# Patient Record
Sex: Female | Born: 1965 | Race: White | Hispanic: No | Marital: Married | Smoking: Former smoker
Health system: Southern US, Community
[De-identification: ages and names within clinical notes are randomized; demographics above are authoritative.]

## PROBLEM LIST (undated history)

## (undated) DIAGNOSIS — R519 Headache, unspecified: Secondary | ICD-10-CM

## (undated) DIAGNOSIS — L719 Rosacea, unspecified: Secondary | ICD-10-CM

## (undated) DIAGNOSIS — G4733 Obstructive sleep apnea (adult) (pediatric): Secondary | ICD-10-CM

## (undated) DIAGNOSIS — E785 Hyperlipidemia, unspecified: Secondary | ICD-10-CM

## (undated) DIAGNOSIS — G709 Myoneural disorder, unspecified: Secondary | ICD-10-CM

## (undated) DIAGNOSIS — R011 Cardiac murmur, unspecified: Secondary | ICD-10-CM

## (undated) DIAGNOSIS — M51369 Other intervertebral disc degeneration, lumbar region without mention of lumbar back pain or lower extremity pain: Secondary | ICD-10-CM

## (undated) DIAGNOSIS — T7840XA Allergy, unspecified, initial encounter: Secondary | ICD-10-CM

## (undated) DIAGNOSIS — F411 Generalized anxiety disorder: Secondary | ICD-10-CM

## (undated) DIAGNOSIS — Z9889 Other specified postprocedural states: Secondary | ICD-10-CM

## (undated) DIAGNOSIS — T8859XA Other complications of anesthesia, initial encounter: Secondary | ICD-10-CM

## (undated) DIAGNOSIS — R112 Nausea with vomiting, unspecified: Secondary | ICD-10-CM

## (undated) DIAGNOSIS — J45909 Unspecified asthma, uncomplicated: Secondary | ICD-10-CM

## (undated) DIAGNOSIS — F952 Tourette's disorder: Secondary | ICD-10-CM

## (undated) DIAGNOSIS — J42 Unspecified chronic bronchitis: Secondary | ICD-10-CM

## (undated) DIAGNOSIS — J189 Pneumonia, unspecified organism: Secondary | ICD-10-CM

## (undated) DIAGNOSIS — L709 Acne, unspecified: Secondary | ICD-10-CM

## (undated) DIAGNOSIS — S42009A Fracture of unspecified part of unspecified clavicle, initial encounter for closed fracture: Secondary | ICD-10-CM

## (undated) DIAGNOSIS — T4145XA Adverse effect of unspecified anesthetic, initial encounter: Secondary | ICD-10-CM

## (undated) DIAGNOSIS — S43006A Unspecified dislocation of unspecified shoulder joint, initial encounter: Secondary | ICD-10-CM

## (undated) DIAGNOSIS — F32A Depression, unspecified: Secondary | ICD-10-CM

## (undated) DIAGNOSIS — G4730 Sleep apnea, unspecified: Secondary | ICD-10-CM

## (undated) DIAGNOSIS — F329 Major depressive disorder, single episode, unspecified: Secondary | ICD-10-CM

## (undated) DIAGNOSIS — M5136 Other intervertebral disc degeneration, lumbar region: Secondary | ICD-10-CM

## (undated) DIAGNOSIS — K219 Gastro-esophageal reflux disease without esophagitis: Secondary | ICD-10-CM

## (undated) HISTORY — PX: MOUTH SURGERY: SHX715

## (undated) HISTORY — PX: OTHER SURGICAL HISTORY: SHX169

## (undated) HISTORY — DX: Major depressive disorder, single episode, unspecified: F32.9

## (undated) HISTORY — DX: Allergy, unspecified, initial encounter: T78.40XA

## (undated) HISTORY — PX: KNEE ARTHROPLASTY: SHX992

## (undated) HISTORY — DX: Unspecified dislocation of unspecified shoulder joint, initial encounter: S43.006A

## (undated) HISTORY — DX: Fracture of unspecified part of unspecified clavicle, initial encounter for closed fracture: S42.009A

## (undated) HISTORY — DX: Sleep apnea, unspecified: G47.30

## (undated) HISTORY — DX: Myoneural disorder, unspecified: G70.9

## (undated) HISTORY — PX: KNEE ARTHROSCOPY: SUR90

## (undated) HISTORY — PX: BANKHART REPAIR SHOULDER: SUR120

## (undated) HISTORY — DX: Acne, unspecified: L70.9

## (undated) HISTORY — PX: RHINOPLASTY: SUR1284

## (undated) HISTORY — DX: Depression, unspecified: F32.A

---

## 2008-07-12 ENCOUNTER — Encounter: Admission: RE | Admit: 2008-07-12 | Discharge: 2008-07-12 | Payer: Self-pay | Admitting: Family Medicine

## 2008-12-29 ENCOUNTER — Encounter: Admission: RE | Admit: 2008-12-29 | Discharge: 2008-12-29 | Payer: Self-pay | Admitting: Family Medicine

## 2009-01-27 ENCOUNTER — Emergency Department (HOSPITAL_COMMUNITY): Admission: EM | Admit: 2009-01-27 | Discharge: 2009-01-27 | Payer: Self-pay | Admitting: Emergency Medicine

## 2009-08-07 ENCOUNTER — Ambulatory Visit: Payer: Self-pay | Admitting: Internal Medicine

## 2009-08-07 ENCOUNTER — Observation Stay (HOSPITAL_COMMUNITY): Admission: EM | Admit: 2009-08-07 | Discharge: 2009-08-08 | Payer: Self-pay | Admitting: Emergency Medicine

## 2009-08-28 ENCOUNTER — Ambulatory Visit: Payer: Self-pay | Admitting: Cardiovascular Disease

## 2009-08-28 DIAGNOSIS — R079 Chest pain, unspecified: Secondary | ICD-10-CM

## 2009-11-12 ENCOUNTER — Encounter: Admission: RE | Admit: 2009-11-12 | Discharge: 2009-11-12 | Payer: Self-pay | Admitting: Orthopedic Surgery

## 2010-04-16 NOTE — Assessment & Plan Note (Signed)
Summary: eph/chest pains   Visit Type:  Initial Consult Primary Provider:  Candise Bowens. Stone, MD   History of Present Illness: Danielle Krueger is seen today at the request of [facility]/  She was seen there for chest pain on 5/24.  Pain was atypical, nonexertional, sharp and both sides of chest.  R/O with normal ECG.  5/25 had normal stress echo which I reviewed.  Since D/C one self limited episode.  No associated diaphoresis, palpitations, edema or syncope.  Indicates it is different than her previous painic attacks.  Has a lot going on.  She raises 5 boys and a girl.  Works for a Management consultant.  She has a brother that she is very close to who is dying of glyoblastoma.  After review of data and stress echo I reassured her that I thought her heart was ok and she could start an exercise program at [employer].  In the future a cardiac CT would be a reasonable test for recurrent symptoms  Current Problems (verified): 1)  Chest Pain Unspecified  (ICD-786.50)  Current Medications (verified): 1)  Prestiq 50mg  .... Once Daily 2)  Menicycline 50mg  .... Two Times A Day  Allergies (verified): 1)  ! * Metrogel  Family History: negative premature CAD  Social History: Married 6 children combined remarriage Nonsmoker Occasional ETOH Works for Pension scheme manager Wants to work out at [employer]  Review of Systems       Denies fever, malais, weight loss, blurry vision, decreased visual acuity, cough, sputum, SOB, hemoptysis, pleuritic pain, palpitaitons, heartburn, abdominal pain, melena, lower extremity edema, claudication, or rash.   Vital Signs:  Patient profile:   45 year old female Height:      66 inches Weight:      160 pounds BMI:     25.92 Pulse rate:   79 / minute BP sitting:   122 / 82  (left arm)  Vitals Entered By: Laurance Flatten CMA (August 28, 2009 3:37 PM) Comments pt has concerns about sleep apnea   Physical Exam  General:  Affect appropriate Healthy:  appears stated age HEENT: normal Neck  supple with no adenopathy JVP normal no bruits no thyromegaly Lungs clear with no wheezing and good diaphragmatic motion Heart:  S1/S2 no murmur,rub, gallop or click PMI normal Abdomen: benighn, BS positve, no tenderness, no AAA no bruit.  No HSM or HJR Distal pulses intact with no bruits No edema Neuro non-focal Skin warm and dry    Impression & Recommendations:  Problem # 1:  CHEST PAIN UNSPECIFIED (ICD-786.50) Atypical with normal ECG and normal stress echo.  No need for further w/u at this time Orders: EKG w/ Interpretation (93000)  Patient Instructions: 1)  Your physician recommends that you schedule a follow-up appointment in: as needed 2)  Your physician recommends that you continue on your current medications as directed. Please refer to the Current Medication list given to you today.   EKG Report  Procedure date:  08/28/2009  Findings:      NSR 79 Normal ECG

## 2010-06-03 LAB — URINE MICROSCOPIC-ADD ON

## 2010-06-03 LAB — DIFFERENTIAL
Basophils Absolute: 0.1 10*3/uL (ref 0.0–0.1)
Eosinophils Absolute: 0.4 10*3/uL (ref 0.0–0.7)
Eosinophils Relative: 6 % — ABNORMAL HIGH (ref 0–5)
Lymphocytes Relative: 24 % (ref 12–46)
Neutro Abs: 4.9 10*3/uL (ref 1.7–7.7)
Neutrophils Relative %: 61 % (ref 43–77)

## 2010-06-03 LAB — URINALYSIS, ROUTINE W REFLEX MICROSCOPIC
Ketones, ur: NEGATIVE mg/dL
Leukocytes, UA: NEGATIVE
Nitrite: NEGATIVE
Specific Gravity, Urine: 1.027 (ref 1.005–1.030)
pH: 5.5 (ref 5.0–8.0)

## 2010-06-03 LAB — POCT CARDIAC MARKERS
CKMB, poc: <1 ng/mL — ABNORMAL LOW (ref 1.0–8.0)
Myoglobin, poc: 42 ng/mL (ref 12–200)
Myoglobin, poc: 44.6 ng/mL (ref 12–200)
Myoglobin, poc: 46.3 ng/mL (ref 12–200)
Troponin i, poc: <0.05 ng/mL (ref 0.00–0.09)
Troponin i, poc: <0.05 ng/mL (ref 0.00–0.09)

## 2010-06-03 LAB — BASIC METABOLIC PANEL
BUN: 9 mg/dL (ref 6–23)
CO2: 24 mEq/L (ref 19–32)
Creatinine, Ser: 0.56 mg/dL (ref 0.4–1.2)
GFR calc non Af Amer: >60 mL/min (ref >60)
Potassium: 3.8 mEq/L (ref 3.5–5.1)

## 2010-06-03 LAB — CBC
MCV: 95.1 fL (ref 78.0–100.0)
WBC: 7.9 10*3/uL (ref 4.0–10.5)

## 2010-09-05 ENCOUNTER — Other Ambulatory Visit: Payer: Self-pay | Admitting: Orthopedic Surgery

## 2010-09-05 DIAGNOSIS — M545 Low back pain: Secondary | ICD-10-CM

## 2010-09-10 ENCOUNTER — Ambulatory Visit
Admission: RE | Admit: 2010-09-10 | Discharge: 2010-09-10 | Disposition: A | Discharge status: Home / Self Care | Payer: Self-pay | Source: Ambulatory Visit | Attending: Orthopedic Surgery | Admitting: Orthopedic Surgery

## 2010-09-10 DIAGNOSIS — M545 Low back pain: Secondary | ICD-10-CM

## 2010-09-17 ENCOUNTER — Encounter: Payer: Self-pay | Admitting: Cardiovascular Disease

## 2010-12-31 ENCOUNTER — Other Ambulatory Visit: Payer: Self-pay | Admitting: Family Medicine

## 2010-12-31 DIAGNOSIS — Z1231 Encounter for screening mammogram for malignant neoplasm of breast: Secondary | ICD-10-CM

## 2011-01-08 ENCOUNTER — Other Ambulatory Visit: Payer: Self-pay | Admitting: Family Medicine

## 2011-01-08 DIAGNOSIS — N949 Unspecified condition associated with female genital organs and menstrual cycle: Secondary | ICD-10-CM

## 2011-01-10 ENCOUNTER — Ambulatory Visit
Admission: RE | Admit: 2011-01-10 | Discharge: 2011-01-10 | Disposition: A | Discharge status: Home / Self Care | Payer: 59 | Source: Ambulatory Visit | Attending: Family Medicine | Admitting: Family Medicine

## 2011-01-10 DIAGNOSIS — N949 Unspecified condition associated with female genital organs and menstrual cycle: Secondary | ICD-10-CM

## 2011-01-20 ENCOUNTER — Ambulatory Visit
Admission: RE | Admit: 2011-01-20 | Discharge: 2011-01-20 | Disposition: A | Discharge status: Home / Self Care | Payer: 59 | Source: Ambulatory Visit | Attending: Family Medicine | Admitting: Family Medicine

## 2011-01-20 DIAGNOSIS — Z1231 Encounter for screening mammogram for malignant neoplasm of breast: Secondary | ICD-10-CM

## 2011-11-13 ENCOUNTER — Encounter (HOSPITAL_COMMUNITY): Payer: Self-pay

## 2011-11-20 ENCOUNTER — Encounter (HOSPITAL_COMMUNITY)
Admission: RE | Admit: 2011-11-20 | Discharge: 2011-11-20 | Disposition: A | Discharge status: Home / Self Care | Payer: 59 | Source: Ambulatory Visit | Attending: Obstetrics and Gynecology | Admitting: Obstetrics and Gynecology

## 2011-11-20 ENCOUNTER — Encounter (HOSPITAL_COMMUNITY): Payer: Self-pay

## 2011-11-20 HISTORY — DX: Adverse effect of unspecified anesthetic, initial encounter: T41.45XA

## 2011-11-20 HISTORY — DX: Other complications of anesthesia, initial encounter: T88.59XA

## 2011-11-20 LAB — CBC
MCV: 89.9 fL (ref 78.0–100.0)
Platelets: 414 10*3/uL — ABNORMAL HIGH (ref 150–400)
RBC: 4.04 MIL/uL (ref 3.87–5.11)
RDW: 14.5 % (ref 11.5–15.5)
WBC: 7.5 10*3/uL (ref 4.0–10.5)

## 2011-11-20 NOTE — Patient Instructions (Addendum)
20 Danielle Krueger  11/20/2011   Your procedure is scheduled on:  11/28/11  Enter through the Main Entrance of [facility] at 6 AM.  Pick up the phone at the desk and dial [phone].   Call this number if you have problems the morning of surgery: [phone]   Remember:   Do not eat food:After Midnight.  Do not drink clear liquids: After Midnight.  Take these medicines the morning of surgery with A SIP OF WATER: NA   Do not wear jewelry, make-up or nail polish.  Do not wear lotions, powders, or perfumes. You may wear deodorant.  Do not shave 48 hours prior to surgery.  Do not bring valuables to the hospital.  Contacts, dentures or bridgework may not be worn into surgery.  Leave suitcase in the car. After surgery it may be brought to your room.  For patients admitted to the hospital, checkout time is 11:00 AM the day of discharge.   Patients discharged the day of surgery will not be allowed to drive home.  Name and phone number of your driver: husband  [name]  Special Instructions: CHG Shower Use Special Wash: 1/2 bottle night before surgery and 1/2 bottle morning of surgery.   Please read over the following fact sheets that you were given: Surgical Site Infection Prevention

## 2011-11-26 NOTE — H&P (Signed)
46 year old G 2 P 2 with husband having had a vasectomy presents for treatment of severe menorrhagia. She states her period occurs every 25 to 28 days and lasts for 3 to 4 days with large clots. She sometimes has to change her tampons three times a hour.  Ultrasound in the office showed a 6 mm and 6 mm polyps in endometrium.  Medical history History of Vulvar condyloma UTI  Surgical history  None  Meds Pristiq Ativan  Allergic to metrogel  ROS is unremarkable Social history  Married No tobacco  Afebrile Vital signs stable General alert and oriented Lung CTAB Car RRR Abdomen is soft and non tender Pelvic is normal except for above ultrasound findings  IMPRESSION: Menorrhagia Endometrial polyps  PLAN: D and C Hysteroscopy Removal of endometrial polyps Thermachoice endometrial ablation Risks reviewed with the patient Consent signed

## 2011-11-28 ENCOUNTER — Ambulatory Visit (HOSPITAL_COMMUNITY): Admission: RE | Admit: 2011-11-28 | Payer: 59 | Source: Ambulatory Visit | Admitting: Obstetrics and Gynecology

## 2011-11-28 SURGERY — DILATATION & CURETTAGE/HYSTEROSCOPY WITH THERMACHOICE ABLATION
Anesthesia: Choice

## 2011-12-01 ENCOUNTER — Encounter (HOSPITAL_COMMUNITY): Payer: Self-pay

## 2011-12-03 MED ORDER — LACTATED RINGERS IV SOLN: INTRAVENOUS | Status: DC

## 2011-12-04 ENCOUNTER — Encounter (HOSPITAL_COMMUNITY): Payer: Self-pay | Admitting: Anesthesiology

## 2011-12-04 ENCOUNTER — Ambulatory Visit (HOSPITAL_COMMUNITY)
Admission: RE | Admit: 2011-12-04 | Discharge: 2011-12-04 | Disposition: A | Discharge status: Home / Self Care | Payer: 59 | Source: Ambulatory Visit | Attending: Obstetrics and Gynecology | Admitting: Obstetrics and Gynecology

## 2011-12-04 ENCOUNTER — Ambulatory Visit (HOSPITAL_COMMUNITY): Payer: 59 | Admitting: Anesthesiology

## 2011-12-04 ENCOUNTER — Encounter (HOSPITAL_COMMUNITY)
Admission: RE | Disposition: A | Discharge status: Home / Self Care | Payer: Self-pay | Source: Ambulatory Visit | Attending: Obstetrics and Gynecology

## 2011-12-04 DIAGNOSIS — N920 Excessive and frequent menstruation with regular cycle: Secondary | ICD-10-CM | POA: Insufficient documentation

## 2011-12-04 DIAGNOSIS — N840 Polyp of corpus uteri: Secondary | ICD-10-CM

## 2011-12-04 DIAGNOSIS — R079 Chest pain, unspecified: Secondary | ICD-10-CM

## 2011-12-04 LAB — CBC
Hemoglobin: 10.8 g/dL — ABNORMAL LOW (ref 12.0–15.0)
MCH: 29.3 pg (ref 26.0–34.0)
MCHC: 32.3 g/dL (ref 30.0–36.0)
MCV: 90.5 fL (ref 78.0–100.0)
Platelets: 433 10*3/uL — ABNORMAL HIGH (ref 150–400)

## 2011-12-04 SURGERY — DILATATION & CURETTAGE/HYSTEROSCOPY WITH THERMACHOICE ABLATION
Anesthesia: General | Site: Uterus | Wound class: Clean Contaminated

## 2011-12-04 MED ORDER — LIDOCAINE HCL (CARDIAC) 20 MG/ML IV SOLN
INTRAVENOUS | Status: AC
  Filled 2011-12-04: qty 5

## 2011-12-04 MED ORDER — ONDANSETRON HCL 4 MG/2ML IJ SOLN
INTRAMUSCULAR | Status: DC | PRN
  Administered 2011-12-04: 4 mg via INTRAVENOUS

## 2011-12-04 MED ORDER — MIDAZOLAM HCL 5 MG/5ML IJ SOLN
INTRAMUSCULAR | Status: DC | PRN
  Administered 2011-12-04: 2 mg via INTRAVENOUS

## 2011-12-04 MED ORDER — PROPOFOL 10 MG/ML IV EMUL
INTRAVENOUS | Status: DC | PRN
  Administered 2011-12-04: 50 mg via INTRAVENOUS
  Administered 2011-12-04: 200 mg via INTRAVENOUS

## 2011-12-04 MED ORDER — OXYCODONE-ACETAMINOPHEN 5-325 MG PO TABS
ORAL_TABLET | ORAL | Status: AC
  Filled 2011-12-04: qty 1

## 2011-12-04 MED ORDER — LIDOCAINE HCL 1 % IJ SOLN
INTRAMUSCULAR | Status: DC | PRN
  Administered 2011-12-04: 10 mL

## 2011-12-04 MED ORDER — MIDAZOLAM HCL 2 MG/2ML IJ SOLN
INTRAMUSCULAR | Status: AC
  Filled 2011-12-04: qty 2

## 2011-12-04 MED ORDER — IBUPROFEN 200 MG PO TABS: 600.0000 mg | ORAL_TABLET | Freq: Four times a day (QID) | ORAL | Status: DC | PRN

## 2011-12-04 MED ORDER — OXYCODONE-ACETAMINOPHEN 5-325 MG PO TABS
1.0000 | ORAL_TABLET | ORAL | Status: DC | PRN
  Administered 2011-12-04: 1 via ORAL

## 2011-12-04 MED ORDER — OXYCODONE-ACETAMINOPHEN 10-325 MG PO TABS
1.0000 | ORAL_TABLET | ORAL | Status: DC | PRN
Start: 2011-12-04 — End: 2016-11-27

## 2011-12-04 MED ORDER — PROPOFOL 10 MG/ML IV EMUL
INTRAVENOUS | Status: AC
  Filled 2011-12-04: qty 20

## 2011-12-04 MED ORDER — DEXAMETHASONE SODIUM PHOSPHATE 10 MG/ML IJ SOLN
INTRAMUSCULAR | Status: AC
  Filled 2011-12-04: qty 1

## 2011-12-04 MED ORDER — MIDAZOLAM HCL 2 MG/2ML IJ SOLN: 0.5000 mg | Freq: Once | INTRAMUSCULAR | Status: DC | PRN

## 2011-12-04 MED ORDER — CEFAZOLIN SODIUM-DEXTROSE 2-3 GM-% IV SOLR
2.0000 g | INTRAVENOUS | Status: AC
  Administered 2011-12-04: 2 g via INTRAVENOUS

## 2011-12-04 MED ORDER — MEPERIDINE HCL 25 MG/ML IJ SOLN: 6.2500 mg | INTRAMUSCULAR | Status: DC | PRN

## 2011-12-04 MED ORDER — LACTATED RINGERS IV SOLN
INTRAVENOUS | Status: DC
  Administered 2011-12-04 (×2): via INTRAVENOUS

## 2011-12-04 MED ORDER — LIDOCAINE HCL (CARDIAC) 20 MG/ML IV SOLN
INTRAVENOUS | Status: DC | PRN
  Administered 2011-12-04: 60 mg via INTRAVENOUS

## 2011-12-04 MED ORDER — ONDANSETRON HCL 4 MG/2ML IJ SOLN
INTRAMUSCULAR | Status: AC
  Filled 2011-12-04: qty 2

## 2011-12-04 MED ORDER — LACTATED RINGERS IV SOLN: INTRAVENOUS | Status: DC

## 2011-12-04 MED ORDER — FENTANYL CITRATE 0.05 MG/ML IJ SOLN
INTRAMUSCULAR | Status: AC
  Filled 2011-12-04: qty 2

## 2011-12-04 MED ORDER — KETOROLAC TROMETHAMINE 30 MG/ML IJ SOLN: 15.0000 mg | Freq: Once | INTRAMUSCULAR | Status: DC | PRN

## 2011-12-04 MED ORDER — KETOROLAC TROMETHAMINE 60 MG/2ML IM SOLN
INTRAMUSCULAR | Status: DC | PRN
  Administered 2011-12-04: 30 mg via INTRAMUSCULAR

## 2011-12-04 MED ORDER — PROMETHAZINE HCL 25 MG/ML IJ SOLN: 6.2500 mg | INTRAMUSCULAR | Status: DC | PRN

## 2011-12-04 MED ORDER — FENTANYL CITRATE 0.05 MG/ML IJ SOLN: 25.0000 ug | INTRAMUSCULAR | Status: DC | PRN

## 2011-12-04 MED ORDER — DEXAMETHASONE SODIUM PHOSPHATE 4 MG/ML IJ SOLN
INTRAMUSCULAR | Status: DC | PRN
  Administered 2011-12-04: 10 mg via INTRAVENOUS

## 2011-12-04 MED ORDER — KETOROLAC TROMETHAMINE 60 MG/2ML IM SOLN
INTRAMUSCULAR | Status: AC
  Filled 2011-12-04: qty 2

## 2011-12-04 MED ORDER — KETOROLAC TROMETHAMINE 30 MG/ML IJ SOLN
INTRAMUSCULAR | Status: DC | PRN
  Administered 2011-12-04: 30 mg via INTRAVENOUS

## 2011-12-04 MED ORDER — CEFAZOLIN SODIUM-DEXTROSE 2-3 GM-% IV SOLR
INTRAVENOUS | Status: AC
  Filled 2011-12-04: qty 50

## 2011-12-04 MED ORDER — DEXTROSE 5 % IV SOLN
INTRAVENOUS | Status: DC | PRN
  Administered 2011-12-04: 1000 mL

## 2011-12-04 MED ORDER — FENTANYL CITRATE 0.05 MG/ML IJ SOLN
INTRAMUSCULAR | Status: DC | PRN
  Administered 2011-12-04 (×2): 50 ug via INTRAVENOUS

## 2011-12-04 SURGICAL SUPPLY — 13 items
CANISTER SUCTION 2500CC (MISCELLANEOUS) ×2 IMPLANT
CATH ROBINSON RED A/P 16FR (CATHETERS) ×2 IMPLANT
CATH THERMACHOICE III (CATHETERS) ×2 IMPLANT
CLOTH BEACON ORANGE TIMEOUT ST (SAFETY) ×2 IMPLANT
CONTAINER PREFILL 10% NBF 60ML (FORM) ×2 IMPLANT
DRESSING TELFA 8X3 (GAUZE/BANDAGES/DRESSINGS) ×2 IMPLANT
GLOVE BIO SURGEON STRL SZ 6.5 (GLOVE) ×4 IMPLANT
GOWN PREVENTION PLUS LG XLONG (DISPOSABLE) ×2 IMPLANT
GOWN STRL REIN XL XLG (GOWN DISPOSABLE) ×2 IMPLANT
PACK HYSTEROSCOPY LF (CUSTOM PROCEDURE TRAY) ×2 IMPLANT
PAD OB MATERNITY 4.3X12.25 (PERSONAL CARE ITEMS) ×2 IMPLANT
TOWEL OR 17X24 6PK STRL BLUE (TOWEL DISPOSABLE) ×4 IMPLANT
WATER STERILE IRR 1000ML POUR (IV SOLUTION) ×2 IMPLANT

## 2011-12-04 NOTE — Anesthesia Procedure Notes (Signed)
Procedure Name: LMA Insertion Date/Time: 12/04/2011 7:28 AM Performed by: Graciela Husbands Pre-anesthesia Checklist: Patient identified, Patient being monitored, Emergency Drugs available, Timeout performed and Suction available Patient Re-evaluated:Patient Re-evaluated prior to inductionOxygen Delivery Method: Circle system utilized Preoxygenation: Pre-oxygenation with 100% oxygen Intubation Type: IV induction Ventilation: Mask ventilation without difficulty LMA: LMA inserted LMA Size: 4.0 Number of attempts: 1 Placement Confirmation: positive ETCO2 and breath sounds checked- equal and bilateral Tube secured with: Tape Dental Injury: Teeth and Oropharynx as per pre-operative assessment

## 2011-12-04 NOTE — Anesthesia Postprocedure Evaluation (Signed)
  Anesthesia Post-op Note  Patient: Danielle Krueger  Procedure(s) Performed: Procedure(s) (LRB) with comments: DILATATION & CURETTAGE/HYSTEROSCOPY WITH THERMACHOICE ABLATION (N/A)  Patient Location: PACU  Anesthesia Type: General  Level of Consciousness: awake, alert  and oriented  Airway and Oxygen Therapy: Patient Spontanous Breathing  Post-op Pain: none  Post-op Assessment: Post-op Vital signs reviewed, Patient's Cardiovascular Status Stable, Respiratory Function Stable, Patent Airway, No signs of Nausea or vomiting and Pain level controlled  Post-op Vital Signs: Reviewed and stable  Complications: No apparent anesthesia complications

## 2011-12-04 NOTE — Transfer of Care (Signed)
Immediate Anesthesia Transfer of Care Note  Patient: Danielle Krueger  Procedure(s) Performed: Procedure(s) (LRB) with comments: DILATATION & CURETTAGE/HYSTEROSCOPY WITH THERMACHOICE ABLATION (N/A)  Patient Location: PACU  Anesthesia Type: General  Level of Consciousness: awake, alert  and oriented  Airway & Oxygen Therapy: Patient Spontanous Breathing and Patient connected to nasal cannula oxygen  Post-op Assessment: Report given to PACU RN and Post -op Vital signs reviewed and stable  Post vital signs: Reviewed and stable  Complications: No apparent anesthesia complications

## 2011-12-04 NOTE — Brief Op Note (Signed)
12/04/2011  8:04 AM  PATIENT:  Danielle Krueger  46 y.o. female  PRE-OPERATIVE DIAGNOSIS:  menorrhagia, Endometrial polyps  POST-OPERATIVE DIAGNOSIS:  menorrhagia, Endometrial polyps  PROCEDURE:  Procedure(s) (LRB) with comments: DILATATION & CURETTAGE/HYSTEROSCOPY WITH THERMACHOICE ABLATION (N/A) Removal of endometrial polyps  SURGEON:  Surgeon(s) and Role:    * Jeani Hawking, MD - Primary  PHYSICIAN ASSISTANT:   ASSISTANTS: none   ANESTHESIA:   paracervical block and MAC  EBL:  Total I/O In: 200 [I.V.:200] Out: -   BLOOD ADMINISTERED:none  DRAINS: none   LOCAL MEDICATIONS USED:  XYLOCAINE   SPECIMEN:  Source of Specimen:  Uterine curettings  DISPOSITION OF SPECIMEN:  PATHOLOGY  COUNTS:  YES  TOURNIQUET:  * No tourniquets in log *  DICTATION: .Other Dictation: Dictation Number [id number]  PLAN OF CARE: Discharge to home after PACU  PATIENT DISPOSITION:  PACU - hemodynamically stable.   Delay start of Pharmacological VTE agent (>24hrs) due to surgical blood loss or risk of bleeding: not applicable

## 2011-12-04 NOTE — Anesthesia Preprocedure Evaluation (Signed)
Anesthesia Evaluation  Patient identified by MRN, date of birth, ID band Patient awake    Reviewed: Allergy & Precautions, H&P , Patient's Chart, lab work & pertinent test results, reviewed documented beta blocker date and time   Airway Mallampati: II TM Distance: >3 FB Neck ROM: full    Dental No notable dental hx.    Pulmonary neg pulmonary ROS,  breath sounds clear to auscultation  Pulmonary exam normal       Cardiovascular Exercise Tolerance: Good negative cardio ROS  Rhythm:regular Rate:Normal     Neuro/Psych negative neurological ROS  negative psych ROS   GI/Hepatic negative GI ROS, Neg liver ROS,   Endo/Other  negative endocrine ROS  Renal/GU negative Renal ROS     Musculoskeletal   Abdominal   Peds  Hematology negative hematology ROS (+)   Anesthesia Other Findings  Chest pain   Eval ER 5/24 R/O normal ECG negative markers, ? anxiety Acne        Depressed     Broken collarbone        Dislocated shoulder   right  Complication of anesthesia   age 47ys, "could not breathe after anes started"     Reproductive/Obstetrics negative OB ROS                           Anesthesia Physical Anesthesia Plan  ASA: I  Anesthesia Plan: General LMA   Post-op Pain Management:    Induction:   Airway Management Planned:   Additional Equipment:   Intra-op Plan:   Post-operative Plan:   Informed Consent: I have reviewed the patients History and Physical, chart, labs and discussed the procedure including the risks, benefits and alternatives for the proposed anesthesia with the patient or authorized representative who has indicated his/her understanding and acceptance.   Dental Advisory Given  Plan Discussed with: CRNA, Surgeon and Anesthesiologist  Anesthesia Plan Comments:         Anesthesia Quick Evaluation

## 2011-12-04 NOTE — Progress Notes (Signed)
History and physical on the chart. No siginificant changes Will proceed with above Consent signed

## 2011-12-05 NOTE — Op Note (Signed)
NAME:  Danielle Krueger, Danielle Krueger           ACCOUNT NO.:  [id number]  MEDICAL RECORD NO.:  [id number]  LOCATION:  [facility]                          FACILITY:  [facility]  PHYSICIAN:  Maleek Craver L. Arshawn Valdez, M.D.DATE OF BIRTH:  [birth year]  DATE OF PROCEDURE:  12/04/2011 DATE OF DISCHARGE:  12/04/2011                              OPERATIVE REPORT   PREOPERATIVE DIAGNOSIS:  Menorrhagia and endometrial polyps.  POSTOPERATIVE DIAGNOSIS:  Menorrhagia and endometrial polyps.  PROCEDURE:  Dilatation and curettage, diagnostic hysteroscopy, removal of endometrial polyps, and ThermaChoice endometrial ablation.  SURGEON:  Nathanie Ottley L. Vincente Poli, M.D.  ANESTHESIA:  MAC with paracervical block.  EBL:  Less than 50 mL.  COMPLICATIONS:  None.  PATHOLOGY:  Uterine curettings.  DRAINS:  None.  PROCEDURE:  The patient had been thoroughly consented about the risk of this procedure in my office and consent was signed.  She was taken to the operating room.  Her anesthesia was administered without difficulty. Time-out was performed.  She was prepped and draped.  In and out catheter was used to empty the bladder.  Speculum was inserted into the vagina.  The cervix was grasped with a tenaculum and a paracervical block was performed in standard fashion.  The cervical internal os was gently dilated using Pratt dilators.  The diagnostic hysteroscope was inserted into the uterine cavity with excellent visualization.  There were 2 small endometrial polyps noted.  A sharp curette was inserted and the uterus was thoroughly curetted of all tissue.  Hysteroscope was reinserted and the uterine cavity was clean.  The ThermaChoice balloon was inserted and the ThermaChoice endometrial ablation was performed according to the manufacturer's specifications.  The intact balloon was then removed.  The patient tolerated the procedure well.  All sponge, lap, and instrument counts were correct x2.  The patient went to the recovery room in a  stable condition.     Ebon Ketchum L. Vincente Poli, M.D.     Florestine Avers  D:  12/04/2011  T:  12/05/2011  Job:  [id number]

## 2011-12-20 ENCOUNTER — Inpatient Hospital Stay (HOSPITAL_COMMUNITY)
Admission: AD | Admit: 2011-12-20 | Discharge: 2011-12-21 | Disposition: A | Discharge status: Home / Self Care | Payer: 59 | Source: Ambulatory Visit | Attending: Obstetrics and Gynecology | Admitting: Obstetrics and Gynecology

## 2011-12-20 ENCOUNTER — Encounter (HOSPITAL_COMMUNITY): Payer: Self-pay

## 2011-12-20 DIAGNOSIS — N949 Unspecified condition associated with female genital organs and menstrual cycle: Secondary | ICD-10-CM | POA: Insufficient documentation

## 2011-12-20 DIAGNOSIS — N3945 Continuous leakage: Secondary | ICD-10-CM | POA: Insufficient documentation

## 2011-12-20 DIAGNOSIS — N390 Urinary tract infection, site not specified: Secondary | ICD-10-CM

## 2011-12-20 LAB — URINE MICROSCOPIC-ADD ON

## 2011-12-20 LAB — CBC WITH DIFFERENTIAL/PLATELET
Basophils Absolute: 0.1 10*3/uL (ref 0.0–0.1)
Basophils Relative: 1 % (ref 0–1)
HCT: 31.1 % — ABNORMAL LOW (ref 36.0–46.0)
Hemoglobin: 10 g/dL — ABNORMAL LOW (ref 12.0–15.0)
Lymphocytes Relative: 34 % (ref 12–46)
Monocytes Absolute: 0.7 10*3/uL (ref 0.1–1.0)
Neutro Abs: 4.4 10*3/uL (ref 1.7–7.7)
Neutrophils Relative %: 47 % (ref 43–77)
RDW: 14.8 % (ref 11.5–15.5)
WBC: 9.4 10*3/uL (ref 4.0–10.5)

## 2011-12-20 LAB — URINALYSIS, ROUTINE W REFLEX MICROSCOPIC
Glucose, UA: NEGATIVE mg/dL
Leukocytes, UA: NEGATIVE
Nitrite: NEGATIVE
Protein, ur: NEGATIVE mg/dL
pH: 5.5 (ref 5.0–8.0)

## 2011-12-20 MED ORDER — IBUPROFEN 600 MG PO TABS
600.0000 mg | ORAL_TABLET | Freq: Once | ORAL | Status: AC
  Administered 2011-12-20: 600 mg via ORAL
  Filled 2011-12-20: qty 1

## 2011-12-20 MED ORDER — CIPROFLOXACIN HCL 500 MG PO TABS: 500.0000 mg | ORAL_TABLET | Freq: Two times a day (BID) | ORAL | Status: DC

## 2011-12-20 NOTE — MAU Provider Note (Signed)
History     CSN: [id number]  Arrival date and time: 12/20/11 2051   First Provider Initiated Contact with Patient 12/20/11 2157      Chief Complaint  Patient presents with  . Back Pain   HPI This is a 46 y.o. female who is s/p ablation and removal of two endometrial polyps on 9/19.  Has had pelvic pressure and urine leaking for 2 days. Concerned it may be a UTI.  Denies nausea, vomiting, diarrhea or constipation. Denies fever, but has had some chills.  RN Note: Had ablation 9/19 and couple fibroids removed. Did well. Couple days ago started having low back pain and pressue. Urinating more than usual but no pain. Today was at wedding and reunion and on feet a lot. Uncomfortable with pressure and leaking some urine which I've never had  OB History    Grav Para Term Preterm Abortions TAB SAB Ect Mult Living   2 2        2       Past Medical History  Diagnosis Date  . Chest pain     Eval ER 5/24 R/O normal ECG negative markers, ? anxiety  . Acne   . Broken collarbone   . Dislocated shoulder     right   . Complication of anesthesia     age 71ys, "could not breathe after anes started"   . Depressed     Past Surgical History  Procedure Date  . Rhinoplasty     age 24yrs  . Bankhart repair shoulder   . Knee arthroplasty   . Laser of vulva   . Uterine ablation     Family History  Problem Relation Age of Onset  . Coronary artery disease Neg Hx     premature    History  Substance Use Topics  . Smoking status: Former Games developer  . Smokeless tobacco: Not on file  . Alcohol Use: Yes     rarely    Allergies:  Allergies  Allergen Reactions  . Metronidazole Rash    Prescriptions prior to admission  Medication Sig Dispense Refill  . Aspirin-Salicylamide-Caffeine (BC HEADACHE POWDER PO) Take 1 Package by mouth daily. headache      . desvenlafaxine (PRISTIQ) 50 MG 24 hr tablet Take 50 mg by mouth daily.      Marland Kitchen ibuprofen (ADVIL) 200 MG tablet Take 3 tablets (600 mg  total) by mouth every 6 (six) hours as needed for pain.  30 tablet  0  . LORazepam (ATIVAN) 0.5 MG tablet Take 0.5 mg by mouth at bedtime as needed. sleep      . minocycline (MINOCIN,DYNACIN) 50 MG capsule Take 50 mg by mouth 2 (two) times daily.      Marland Kitchen oxyCODONE-acetaminophen (PERCOCET) 10-325 MG per tablet Take 1 tablet by mouth every 4 (four) hours as needed for pain.  30 tablet  0    ROS See HPI  Physical Exam   Blood pressure 141/87, pulse 86, temperature 98.2 F (36.8 C), temperature source Oral, resp. rate 20, height 5' 5.5" (1.664 m), weight 159 lb 6.4 oz (72.303 kg), last menstrual period 11/26/2011.  Physical Exam  Constitutional: She is oriented to person, place, and time. She appears well-developed and well-nourished. No distress.  Cardiovascular: Normal rate.   Respiratory: Effort normal.  GI: Soft. She exhibits no distension and no mass. There is tenderness (over lower pelvis). There is no rebound and no guarding.  Genitourinary: No vaginal discharge found.  Musculoskeletal: Normal range of motion.  Neurological: She is alert and oriented to person, place, and time.  Skin: Skin is warm and dry.  Psychiatric: She has a normal mood and affect.   No CVA tenderness  MAU Course  Procedures  MDM Will check UA and CBC >> Results for orders placed during the hospital encounter of 12/20/11 (from the past 24 hour(s))  URINALYSIS, ROUTINE W REFLEX MICROSCOPIC     Status: Abnormal   Collection Time   12/20/11  9:20 PM      Component Value Range   Color, Urine YELLOW  YELLOW   APPearance CLEAR  CLEAR   Specific Gravity, Urine 1.010  1.005 - 1.030   pH 5.5  5.0 - 8.0   Glucose, UA NEGATIVE  NEGATIVE mg/dL   Hgb urine dipstick SMALL (*) NEGATIVE   Bilirubin Urine NEGATIVE  NEGATIVE   Ketones, ur NEGATIVE  NEGATIVE mg/dL   Protein, ur NEGATIVE  NEGATIVE mg/dL   Urobilinogen, UA 0.2  0.0 - 1.0 mg/dL   Nitrite NEGATIVE  NEGATIVE   Leukocytes, UA NEGATIVE  NEGATIVE  URINE  MICROSCOPIC-ADD ON     Status: Abnormal   Collection Time   12/20/11  9:20 PM      Component Value Range   Squamous Epithelial / LPF FEW (*) RARE   WBC, UA 0-2  <3 WBC/hpf   RBC / HPF 0-2  <3 RBC/hpf   Bacteria, UA FEW (*) RARE  CBC WITH DIFFERENTIAL     Status: Abnormal   Collection Time   12/20/11 10:04 PM      Component Value Range   WBC 9.4  4.0 - 10.5 K/uL   RBC 3.37 (*) 3.87 - 5.11 MIL/uL   Hemoglobin 10.0 (*) 12.0 - 15.0 g/dL   HCT 16.1 (*) 09.6 - 04.5 %   MCV 92.3  78.0 - 100.0 fL   MCH 29.7  26.0 - 34.0 pg   MCHC 32.2  30.0 - 36.0 g/dL   RDW 40.9  81.1 - 91.4 %   Platelets 453 (*) 150 - 400 K/uL   Neutrophils Relative 47  43 - 77 %   Neutro Abs 4.4  1.7 - 7.7 K/uL   Lymphocytes Relative 34  12 - 46 %   Lymphs Abs 3.2  0.7 - 4.0 K/uL   Monocytes Relative 8  3 - 12 %   Monocytes Absolute 0.7  0.1 - 1.0 K/uL   Eosinophils Relative 10 (*) 0 - 5 %   Eosinophils Absolute 0.9 (*) 0.0 - 0.7 K/uL   Basophils Relative 1  0 - 1 %   Basophils Absolute 0.1  0.0 - 0.1 K/uL     Assessment and Plan  A:  Pelvic pressure       Urinary leakage      Possible UTI post surgery  P:  Discussed with DR Vincente Poli       Will culture urine and treat presumptively.       Rx Cipro       Call office Monday morning if not starting to feel better [facility] 12/20/2011, 10:15 PM

## 2011-12-20 NOTE — MAU Note (Signed)
Had ablation 9/19 and couple fibroids removed. Did well. Couple days ago started having low back pain and pressue. Urinating more than usual but no pain. Today was at wedding and reunion and on feet a lot. Uncomfortable with pressure and leaking some urine which I've never had

## 2011-12-22 LAB — URINE CULTURE

## 2012-08-31 ENCOUNTER — Other Ambulatory Visit: Payer: Self-pay

## 2012-08-31 DIAGNOSIS — Z1231 Encounter for screening mammogram for malignant neoplasm of breast: Secondary | ICD-10-CM

## 2012-10-04 ENCOUNTER — Ambulatory Visit
Admission: RE | Admit: 2012-10-04 | Discharge: 2012-10-04 | Disposition: A | Discharge status: Home / Self Care | Payer: 59 | Source: Ambulatory Visit

## 2012-10-04 DIAGNOSIS — Z1231 Encounter for screening mammogram for malignant neoplasm of breast: Secondary | ICD-10-CM

## 2012-10-05 ENCOUNTER — Other Ambulatory Visit: Payer: Self-pay | Admitting: Family Medicine

## 2012-10-05 DIAGNOSIS — R928 Other abnormal and inconclusive findings on diagnostic imaging of breast: Secondary | ICD-10-CM

## 2012-10-12 ENCOUNTER — Ambulatory Visit
Admission: RE | Admit: 2012-10-12 | Discharge: 2012-10-12 | Disposition: A | Discharge status: Home / Self Care | Payer: 59 | Source: Ambulatory Visit | Attending: Family Medicine | Admitting: Family Medicine

## 2012-10-12 DIAGNOSIS — R928 Other abnormal and inconclusive findings on diagnostic imaging of breast: Secondary | ICD-10-CM

## 2012-10-20 ENCOUNTER — Other Ambulatory Visit: Payer: 59

## 2013-03-22 ENCOUNTER — Other Ambulatory Visit: Payer: Self-pay | Admitting: Family Medicine

## 2013-03-22 DIAGNOSIS — N6002 Solitary cyst of left breast: Secondary | ICD-10-CM

## 2013-04-15 ENCOUNTER — Ambulatory Visit
Admission: RE | Admit: 2013-04-15 | Discharge: 2013-04-15 | Disposition: A | Discharge status: Home / Self Care | Payer: 59 | Source: Ambulatory Visit | Attending: Family Medicine | Admitting: Family Medicine

## 2013-04-15 DIAGNOSIS — N6002 Solitary cyst of left breast: Secondary | ICD-10-CM

## 2013-10-26 ENCOUNTER — Other Ambulatory Visit: Payer: Self-pay | Admitting: Family Medicine

## 2013-10-26 DIAGNOSIS — N6489 Other specified disorders of breast: Secondary | ICD-10-CM

## 2013-12-29 ENCOUNTER — Ambulatory Visit
Admission: RE | Admit: 2013-12-29 | Discharge: 2013-12-29 | Disposition: A | Discharge status: Home / Self Care | Payer: 59 | Source: Ambulatory Visit | Attending: Family Medicine | Admitting: Family Medicine

## 2013-12-29 ENCOUNTER — Encounter (INDEPENDENT_AMBULATORY_CARE_PROVIDER_SITE_OTHER): Payer: Self-pay

## 2013-12-29 DIAGNOSIS — N6489 Other specified disorders of breast: Secondary | ICD-10-CM

## 2014-01-16 ENCOUNTER — Encounter (HOSPITAL_COMMUNITY): Payer: Self-pay

## 2014-03-17 HISTORY — PX: ABDOMINAL HYSTERECTOMY: SHX81

## 2014-12-21 ENCOUNTER — Emergency Department (HOSPITAL_COMMUNITY): Payer: BLUE CROSS/BLUE SHIELD

## 2014-12-21 ENCOUNTER — Emergency Department (HOSPITAL_COMMUNITY)
Admission: EM | Admit: 2014-12-21 | Discharge: 2014-12-21 | Disposition: A | Discharge status: Home / Self Care | Payer: BLUE CROSS/BLUE SHIELD | Attending: Emergency Medicine | Admitting: Emergency Medicine

## 2014-12-21 ENCOUNTER — Encounter (HOSPITAL_COMMUNITY): Payer: Self-pay | Admitting: Emergency Medicine

## 2014-12-21 DIAGNOSIS — Z792 Long term (current) use of antibiotics: Secondary | ICD-10-CM | POA: Insufficient documentation

## 2014-12-21 DIAGNOSIS — N83201 Unspecified ovarian cyst, right side: Secondary | ICD-10-CM | POA: Diagnosis not present

## 2014-12-21 DIAGNOSIS — Z79899 Other long term (current) drug therapy: Secondary | ICD-10-CM | POA: Diagnosis not present

## 2014-12-21 DIAGNOSIS — Z87828 Personal history of other (healed) physical injury and trauma: Secondary | ICD-10-CM | POA: Diagnosis not present

## 2014-12-21 DIAGNOSIS — Z3202 Encounter for pregnancy test, result negative: Secondary | ICD-10-CM | POA: Insufficient documentation

## 2014-12-21 DIAGNOSIS — Z87891 Personal history of nicotine dependence: Secondary | ICD-10-CM | POA: Diagnosis not present

## 2014-12-21 DIAGNOSIS — Z872 Personal history of diseases of the skin and subcutaneous tissue: Secondary | ICD-10-CM | POA: Insufficient documentation

## 2014-12-21 DIAGNOSIS — F329 Major depressive disorder, single episode, unspecified: Secondary | ICD-10-CM | POA: Diagnosis not present

## 2014-12-21 DIAGNOSIS — R1031 Right lower quadrant pain: Secondary | ICD-10-CM

## 2014-12-21 DIAGNOSIS — N83209 Unspecified ovarian cyst, unspecified side: Secondary | ICD-10-CM

## 2014-12-21 LAB — CBC
HCT: 38.6 % (ref 36.0–46.0)
Hemoglobin: 13.1 g/dL (ref 12.0–15.0)
MCH: 31 pg (ref 26.0–34.0)
MCHC: 33.9 g/dL (ref 30.0–36.0)
MCV: 91.5 fL (ref 78.0–100.0)
PLATELETS: 437 10*3/uL — AB (ref 150–400)
RBC: 4.22 MIL/uL (ref 3.87–5.11)
RDW: 12.6 % (ref 11.5–15.5)
WBC: 9 10*3/uL (ref 4.0–10.5)

## 2014-12-21 LAB — COMPREHENSIVE METABOLIC PANEL
ALBUMIN: 4.4 g/dL (ref 3.5–5.0)
ALK PHOS: 77 U/L (ref 38–126)
ALT: 17 U/L (ref 14–54)
ANION GAP: 10 (ref 5–15)
AST: 19 U/L (ref 15–41)
BILIRUBIN TOTAL: 0.5 mg/dL (ref 0.3–1.2)
BUN: 15 mg/dL (ref 6–20)
CALCIUM: 9.3 mg/dL (ref 8.9–10.3)
CO2: 24 mmol/L (ref 22–32)
Chloride: 101 mmol/L (ref 101–111)
Creatinine, Ser: 0.64 mg/dL (ref 0.44–1.00)
GFR calc non Af Amer: >60 mL/min (ref >60)
GLUCOSE: 91 mg/dL (ref 65–99)
POTASSIUM: 3.8 mmol/L (ref 3.5–5.1)
SODIUM: 135 mmol/L (ref 135–145)
TOTAL PROTEIN: 7.6 g/dL (ref 6.5–8.1)

## 2014-12-21 LAB — LIPASE, BLOOD: Lipase: 25 U/L (ref 22–51)

## 2014-12-21 LAB — I-STAT BETA HCG BLOOD, ED (MC, WL, AP ONLY)

## 2014-12-21 MED ORDER — IBUPROFEN 800 MG PO TABS: 800.0000 mg | ORAL_TABLET | Freq: Three times a day (TID) | ORAL | Status: DC

## 2014-12-21 MED ORDER — HYDROCODONE-ACETAMINOPHEN 5-325 MG PO TABS: 2.0000 | ORAL_TABLET | ORAL | Status: DC | PRN

## 2014-12-21 NOTE — ED Notes (Addendum)
Per EMS they were called out for abdominal pain. Pt states she has a hx of ovarian cysts and is on birth control to help prevent them rupturing. Now complaining of central lower abdominal pain, that spikes occasionally. Pt states pain is better with lying down. States when it first happened she felt light headed and nauseous. States the pain is now a 5/10.  Pt states she's on her period now

## 2014-12-21 NOTE — ED Provider Notes (Signed)
CSN: [id number]     Arrival date & time 12/21/14  1445 History  By signing my name below, I, Danielle Krueger, attest that this documentation has been prepared under the direction and in the presence of Danielle Chapel, MD. Electronically Signed: Starleen Krueger, ED Scribe. 12/21/2014. 3:22 PM.   Chief Complaint  Patient presents with  . Abdominal Pain  . Ovarian Cyst   The history is provided by the patient. No language interpreter was used.   HPI Comments: Danielle Krueger is a 49 y.o. female with hx of right ovarian cyst who presents to the Emergency Department complaining of severe, acute, persistent, gradually improving onset RLQ abdominal pain, worse with abrupt movements.  She reports similar episodes over the past several months with menstruation, however, today's episode is more severe and onset more suddenly.  She was seen by her PCP 2 months ago where she had a 5 cm cyst shown by UTS and started on birth control but reports she is still menstruating normally.  She denies SOB, dysuria, diarrhea, appetite changes.  No other abdominal hx or surgery.  Patient has a hx of two vaginal deliveries.   She reports being started on birth control pills by her gynecologist and reports that this has not helped her symptoms over the last couple of months  Past Medical History  Diagnosis Date  . Chest pain     Eval ER 5/24 R/O normal ECG negative markers, ? anxiety  . Acne   . Broken collarbone   . Dislocated shoulder     right   . Complication of anesthesia     age 51ys, "could not breathe after anes started"   . Depressed    Past Surgical History  Procedure Laterality Date  . Rhinoplasty      age 70yrs  . Bankhart repair shoulder    . Knee arthroplasty    . Laser of vulva    . Uterine ablation     Family History  Problem Relation Age of Onset  . Coronary artery disease Neg Hx     premature   Social History  Substance Use Topics  . Smoking status: Former Research scientist (life sciences)  . Smokeless tobacco:  None  . Alcohol Use: Yes     Comment: rarely   OB History    Gravida Para Term Preterm AB TAB SAB Ectopic Multiple Living   2 2        2      Review of Systems  Constitutional: Negative for appetite change.  Respiratory: Negative for shortness of breath.   Gastrointestinal: Positive for abdominal pain. Negative for diarrhea.  Genitourinary: Negative for dysuria.  All other systems reviewed and are negative.     Allergies  Metronidazole  Home Medications   Prior to Admission medications   Medication Sig Start Date End Date Taking? Authorizing Provider  Aspirin-Salicylamide-Caffeine (BC HEADACHE POWDER PO) Take 1 Package by mouth daily. headache    Historical Provider, MD  ciprofloxacin (CIPRO) 500 MG tablet Take 1 tablet (500 mg total) by mouth 2 (two) times daily. 12/20/11   Seabron Spates, CNM  desvenlafaxine (PRISTIQ) 50 MG 24 hr tablet Take 50 mg by mouth daily.    Historical Provider, MD  HYDROcodone-acetaminophen (NORCO/VICODIN) 5-325 MG tablet Take 2 tablets by mouth every 4 (four) hours as needed. 12/21/14   Danielle Chapel, MD  ibuprofen (ADVIL,MOTRIN) 800 MG tablet Take 1 tablet (800 mg total) by mouth 3 (three) times daily. 12/21/14   Danielle Chapel, MD  LORazepam (ATIVAN) 0.5 MG tablet Take 0.5 mg by mouth at bedtime as needed. sleep    Historical Provider, MD  minocycline (MINOCIN,DYNACIN) 50 MG capsule Take 50 mg by mouth 2 (two) times daily.    Historical Provider, MD  oxyCODONE-acetaminophen (PERCOCET) 10-325 MG per tablet Take 1 tablet by mouth every 4 (four) hours as needed for pain. 12/04/11   [name], MD   BP 132/88 mmHg  Pulse 80  Temp(Src) 97.5 F (36.4 C) (Oral)  Resp 20  SpO2 100%  LMP 12/21/2014 Physical Exam  Constitutional: She is oriented to person, place, and time. She appears well-developed and well-nourished. No distress.  HENT:  Head: Normocephalic and atraumatic.  Mouth/Throat: Oropharynx is clear and moist. No oropharyngeal exudate.   Eyes: Conjunctivae and EOM are normal. Pupils are equal, round, and reactive to light. Right eye exhibits no discharge. Left eye exhibits no discharge. No scleral icterus.  Neck: Normal range of motion. Neck supple. No JVD present. No tracheal deviation present. No thyromegaly present.  Cardiovascular: Normal rate, regular rhythm, normal heart sounds and intact distal pulses.  Exam reveals no gallop and no friction rub.   No murmur heard. Pulmonary/Chest: Effort normal and breath sounds normal. No respiratory distress. She has no wheezes. She has no rales.  Abdominal: Soft. Bowel sounds are normal. She exhibits no distension and no mass. There is no tenderness.  supraubic and right lower abdominal tenderness without guarding.    Musculoskeletal: Normal range of motion. She exhibits no edema or tenderness.  Lymphadenopathy:    She has no cervical adenopathy.  Neurological: She is alert and oriented to person, place, and time. Coordination normal.  Skin: Skin is warm and dry. No rash noted. No erythema.  Psychiatric: She has a normal mood and affect. Her behavior is normal.  Nursing note and vitals reviewed.   ED Course  Procedures (including critical care time)  DIAGNOSTIC STUDIES: Oxygen Saturation is 100% on RA, normal by my interpretation.    COORDINATION OF CARE:  3:19 PM Will order labs, UTS of pelvis, and pain medication.  Patient acknowledges and agrees with plan.    Labs Review Labs Reviewed  CBC - Abnormal; Notable for the following:    Platelets 437 (*)    All other components within normal limits  LIPASE, BLOOD  COMPREHENSIVE METABOLIC PANEL  I-STAT BETA HCG BLOOD, ED ([facility], [facility], [facility] ONLY)    Imaging Review US Transvaginal Non-ob  12/21/2014   CLINICAL DATA:  RIGHT lower quadrant pain, history ovarian cyst, question ovarian torsion  EXAM: TRANSABDOMINAL AND TRANSVAGINAL ULTRASOUND OF PELVIS  DOPPLER ULTRASOUND OF OVARIES  TECHNIQUE: Both transabdominal and transvaginal  ultrasound examinations of the pelvis were performed. Transabdominal technique was performed for global imaging of the pelvis including uterus, ovaries, adnexal regions, and pelvic cul-de-sac.  It was necessary to proceed with endovaginal exam following the transabdominal exam to visualize the endometrium and ovaries. Color and duplex Doppler ultrasound was utilized to evaluate blood flow to the ovaries.  COMPARISON:  01/10/2011  FINDINGS: Uterus  Measurements: 9.5 x 5.3 x 6.1 cm. Leiomyoma at upper uterine segment, questionably submucosal, 1.8 x 1.4 x 2.1 cm. Additional small questionable leiomyoma at upper LEFT uterus near cornu 1.1 x 1.4 x 1.1 cm.  Endometrium  Thickness: 5 mm thick, normal.  No endometrial fluid.  Right ovary  Measurements: 4.2 x 2.5 x 2.5 cm. Small complex heterogeneous nodule question hemorrhagic cyst 1.9 x 1.1 x 0.8 cm. No additional RIGHT ovarian mass. Internal  blood flow present on color Doppler imaging.  Left ovary  Measurements: 3.6 x 1.6 x 2.5 cm. Normal morphology without mass. Internal blood flow present on color Doppler imaging.  Pulsed Doppler evaluation of both ovaries demonstrates normal low-resistance arterial and venous waveforms.  Other findings  Trace free pelvic fluid.  IMPRESSION: Two small probable uterine leiomyomata, largest 2.1 cm in greatest size questionably extending submucosal.  Small probable hemorrhagic cyst RIGHT ovary.  No evidence of ovarian torsion.   Electronically Signed   By: Lavonia Dana M.D.   On: 12/21/2014 16:36   US Pelvis Complete  12/21/2014   CLINICAL DATA:  RIGHT lower quadrant pain, history ovarian cyst, question ovarian torsion  EXAM: TRANSABDOMINAL AND TRANSVAGINAL ULTRASOUND OF PELVIS  DOPPLER ULTRASOUND OF OVARIES  TECHNIQUE: Both transabdominal and transvaginal ultrasound examinations of the pelvis were performed. Transabdominal technique was performed for global imaging of the pelvis including uterus, ovaries, adnexal regions, and pelvic  cul-de-sac.  It was necessary to proceed with endovaginal exam following the transabdominal exam to visualize the endometrium and ovaries. Color and duplex Doppler ultrasound was utilized to evaluate blood flow to the ovaries.  COMPARISON:  01/10/2011  FINDINGS: Uterus  Measurements: 9.5 x 5.3 x 6.1 cm. Leiomyoma at upper uterine segment, questionably submucosal, 1.8 x 1.4 x 2.1 cm. Additional small questionable leiomyoma at upper LEFT uterus near cornu 1.1 x 1.4 x 1.1 cm.  Endometrium  Thickness: 5 mm thick, normal.  No endometrial fluid.  Right ovary  Measurements: 4.2 x 2.5 x 2.5 cm. Small complex heterogeneous nodule question hemorrhagic cyst 1.9 x 1.1 x 0.8 cm. No additional RIGHT ovarian mass. Internal blood flow present on color Doppler imaging.  Left ovary  Measurements: 3.6 x 1.6 x 2.5 cm. Normal morphology without mass. Internal blood flow present on color Doppler imaging.  Pulsed Doppler evaluation of both ovaries demonstrates normal low-resistance arterial and venous waveforms.  Other findings  Trace free pelvic fluid.  IMPRESSION: Two small probable uterine leiomyomata, largest 2.1 cm in greatest size questionably extending submucosal.  Small probable hemorrhagic cyst RIGHT ovary.  No evidence of ovarian torsion.   Electronically Signed   By: Lavonia Dana M.D.   On: 12/21/2014 16:36   [place] Art/ven Flow Abd Pelv Doppler  12/21/2014   CLINICAL DATA:  RIGHT lower quadrant pain, history ovarian cyst, question ovarian torsion  EXAM: TRANSABDOMINAL AND TRANSVAGINAL ULTRASOUND OF PELVIS  DOPPLER ULTRASOUND OF OVARIES  TECHNIQUE: Both transabdominal and transvaginal ultrasound examinations of the pelvis were performed. Transabdominal technique was performed for global imaging of the pelvis including uterus, ovaries, adnexal regions, and pelvic cul-de-sac.  It was necessary to proceed with endovaginal exam following the transabdominal exam to visualize the endometrium and ovaries. Color and duplex Doppler  ultrasound was utilized to evaluate blood flow to the ovaries.  COMPARISON:  01/10/2011  FINDINGS: Uterus  Measurements: 9.5 x 5.3 x 6.1 cm. Leiomyoma at upper uterine segment, questionably submucosal, 1.8 x 1.4 x 2.1 cm. Additional small questionable leiomyoma at upper LEFT uterus near cornu 1.1 x 1.4 x 1.1 cm.  Endometrium  Thickness: 5 mm thick, normal.  No endometrial fluid.  Right ovary  Measurements: 4.2 x 2.5 x 2.5 cm. Small complex heterogeneous nodule question hemorrhagic cyst 1.9 x 1.1 x 0.8 cm. No additional RIGHT ovarian mass. Internal blood flow present on color Doppler imaging.  Left ovary  Measurements: 3.6 x 1.6 x 2.5 cm. Normal morphology without mass. Internal blood flow present on color Doppler imaging.  Pulsed Doppler evaluation of both ovaries demonstrates normal low-resistance arterial and venous waveforms.  Other findings  Trace free pelvic fluid.  IMPRESSION: Two small probable uterine leiomyomata, largest 2.1 cm in greatest size questionably extending submucosal.  Small probable hemorrhagic cyst RIGHT ovary.  No evidence of ovarian torsion.   Electronically Signed   By: Lavonia Dana M.D.   On: 12/21/2014 16:36   I have personally reviewed and evaluated these images and lab results as part of my medical decision-making.    EKG Interpretation None      MDM   Final diagnoses:  Hemorrhagic ovarian cyst     The patient does not have focal findings on her exam, this is not consistent with ovarian cyst rupture. She does have some pain in the lower abdomen but this is improving prehospital as well as during her hospital stay. Her bloodwork was unremarkable, her ultrasound shows a hemorrhagic cyst on the right ovary, this is likely the source of her pain. At this time I feel it is appropriate to discharge the patient to follow up with her gynecologist. The patient has expressed her understanding and states that her pain level is one out of 10  Meds given in ED:  Medications - No  data to display  Discharge Medication List as of 12/21/2014  5:25 PM    START taking these medications   Details  HYDROcodone-acetaminophen (NORCO/VICODIN) 5-325 MG tablet Take 2 tablets by mouth every 4 (four) hours as needed., Starting 12/21/2014, Until Discontinued, Print        I, Emmarae Cowdery D, personally performed the services described in this documentation. All medical record entries made by the scribe were at my direction and in my presence.  I have reviewed the chart and discharge instructions and agree that the record reflects my personal performance and is accurate and complete. Taber Sweetser D.  12/22/2014. 9:18 AM.       Danielle Chapel, MD 12/22/14 [phone]

## 2014-12-21 NOTE — Discharge Instructions (Signed)

## 2014-12-21 NOTE — ED Notes (Signed)
Pt back from Ultrasound

## 2015-01-17 ENCOUNTER — Other Ambulatory Visit: Payer: Self-pay | Admitting: Obstetrics and Gynecology

## 2015-06-11 ENCOUNTER — Other Ambulatory Visit: Payer: Self-pay

## 2015-06-11 DIAGNOSIS — Z1231 Encounter for screening mammogram for malignant neoplasm of breast: Secondary | ICD-10-CM

## 2015-06-18 ENCOUNTER — Ambulatory Visit
Admission: RE | Admit: 2015-06-18 | Discharge: 2015-06-18 | Disposition: A | Discharge status: Home / Self Care | Payer: BLUE CROSS/BLUE SHIELD | Source: Ambulatory Visit

## 2015-06-18 DIAGNOSIS — Z1231 Encounter for screening mammogram for malignant neoplasm of breast: Secondary | ICD-10-CM

## 2015-06-24 ENCOUNTER — Emergency Department (HOSPITAL_COMMUNITY)
Admission: EM | Admit: 2015-06-24 | Discharge: 2015-06-24 | Disposition: A | Discharge status: Home / Self Care | Payer: BLUE CROSS/BLUE SHIELD | Attending: Emergency Medicine | Admitting: Emergency Medicine

## 2015-06-24 ENCOUNTER — Encounter (HOSPITAL_COMMUNITY): Payer: Self-pay | Admitting: Emergency Medicine

## 2015-06-24 DIAGNOSIS — Z791 Long term (current) use of non-steroidal anti-inflammatories (NSAID): Secondary | ICD-10-CM | POA: Diagnosis not present

## 2015-06-24 DIAGNOSIS — Y9389 Activity, other specified: Secondary | ICD-10-CM | POA: Insufficient documentation

## 2015-06-24 DIAGNOSIS — Z87891 Personal history of nicotine dependence: Secondary | ICD-10-CM | POA: Diagnosis not present

## 2015-06-24 DIAGNOSIS — F329 Major depressive disorder, single episode, unspecified: Secondary | ICD-10-CM | POA: Diagnosis not present

## 2015-06-24 DIAGNOSIS — Y990 Civilian activity done for income or pay: Secondary | ICD-10-CM | POA: Insufficient documentation

## 2015-06-24 DIAGNOSIS — Z79899 Other long term (current) drug therapy: Secondary | ICD-10-CM | POA: Insufficient documentation

## 2015-06-24 DIAGNOSIS — Z792 Long term (current) use of antibiotics: Secondary | ICD-10-CM | POA: Insufficient documentation

## 2015-06-24 DIAGNOSIS — Y92007 Garden or yard of unspecified non-institutional (private) residence as the place of occurrence of the external cause: Secondary | ICD-10-CM | POA: Diagnosis not present

## 2015-06-24 DIAGNOSIS — Z872 Personal history of diseases of the skin and subcutaneous tissue: Secondary | ICD-10-CM | POA: Diagnosis not present

## 2015-06-24 DIAGNOSIS — W57XXXA Bitten or stung by nonvenomous insect and other nonvenomous arthropods, initial encounter: Secondary | ICD-10-CM | POA: Insufficient documentation

## 2015-06-24 DIAGNOSIS — S90562A Insect bite (nonvenomous), left ankle, initial encounter: Secondary | ICD-10-CM | POA: Diagnosis not present

## 2015-06-24 MED ORDER — DOXYCYCLINE HYCLATE 100 MG PO TABS
100.0000 mg | ORAL_TABLET | Freq: Once | ORAL | Status: AC
  Administered 2015-06-24: 100 mg via ORAL
  Filled 2015-06-24: qty 1

## 2015-06-24 MED ORDER — DOXYCYCLINE HYCLATE 100 MG PO CAPS: 100.0000 mg | ORAL_CAPSULE | Freq: Two times a day (BID) | ORAL | Status: DC

## 2015-06-24 NOTE — Discharge Instructions (Signed)
Insect Bite Mosquitoes, flies, fleas, bedbugs, and many other insects can bite. Insect bites are different from insect stings. A sting is when poison (venom) is injected into the skin. Insect bites can cause pain or itching for a few days, but they are usually not serious. Some insects can spread diseases to people through a bite. SYMPTOMS  Symptoms of an insect bite include:  Itching or pain in the bite area.  Redness and swelling in the bite area.  An open wound (skin ulcer). In many cases, symptoms last for 2-4 days.  DIAGNOSIS  This condition is usually diagnosed based on symptoms and a physical exam. TREATMENT  Treatment is usually not needed for an insect bite. Symptoms often go away on their own. Your health care provider may recommend creams or lotions to help reduce itching. Antibiotic medicines may be prescribed if the bite becomes infected. A tetanus shot may be given in some cases. If you develop an allergic reaction to an insect bite, your health care provider will prescribe medicines to treat the reaction (antihistamines). This is rare. HOME CARE INSTRUCTIONS  Do not scratch the bite area.  Keep the bite area clean and dry. Wash the bite area daily with soap and water as told by your health care provider.  If directed, applyice to the bite area.  Put ice in a plastic bag.  Place a towel between your skin and the bag.  Leave the ice on for 20 minutes, 2-3 times per day.  To help reduce itching and swelling, try applying a baking soda paste, cortisone cream, or calamine lotion to the bite area as told by your health care provider.  Apply or take over-the-counter and prescription medicines only as told by your health care provider.  If you were prescribed an antibiotic medicine, use it as told by your health care provider. Do not stop using the antibiotic even if your condition improves.  Keep all follow-up visits as told by your health care provider. This is  important. PREVENTION   Use insect repellent. The best insect repellents contain:  DEET, picaridin, oil of lemon eucalyptus (OLE), or IR3535.  Higher amounts of an active ingredient.  When you are outdoors, wear clothing that covers your arms and legs.  Avoid opening windows that do not have window screens. SEEK MEDICAL CARE IF:  You have increased redness, swelling, or pain in the bite area.  You have a fever. SEEK IMMEDIATE MEDICAL CARE IF:   You have joint pain.   You have fluid, blood, or pus coming from the bite area.  You have a headache or neck pain.  You have unusual weakness.  You have a rash.  You have chest pain or shortness of breath.  You have abdominal pain, nausea, or vomiting.  You feel unusually tired or sleepy.   This information is not intended to replace advice given to you by your health care provider. Make sure you discuss any questions you have with your health care provider.   Document Released: 04/10/2004 Document Revised: 11/22/2014 Document Reviewed: 07/19/2014 Elsevier Interactive Patient Education 2016 [employer]  

## 2015-06-24 NOTE — ED Notes (Signed)
PT DISCHARGED. INSTRUCTIONS AND PRESCRIPTION GIVEN. AAOX3. PT IN NO APPARENT DISTRESS. THE OPPORTUNITY TO ASK QUESTIONS WAS PROVIDED. 

## 2015-06-24 NOTE — ED Notes (Signed)
Patient was working in the garden with long pants and socks on. Patient stated the she noticed her left lower leg had started swelling and hurting.

## 2015-06-24 NOTE — ED Provider Notes (Signed)
CSN: [id number]     Arrival date & time 06/24/15  2016 History  By signing my name below, I, Danielle Krueger, attest that this documentation has been prepared under the direction and in the presence of [name], PA-C. Electronically Signed: Hansel Krueger, ED Scribe. 06/24/2015. 8:34 PM.    Chief Complaint  Patient presents with  . Insect Bite   The history is provided by the patient. No language interpreter was used.   HPI Comments: Danielle Krueger is a 50 y.o. female who presents to the Emergency Department complaining of an area of pain, erythema and swelling to the left lateral ankle onset this evening. Pt states she was working in the yard all day but did not notice any specific bites. She was wearing long pants and work boots that extended past her ankle. She reports that she only noticed her symptoms after she took her boots off. Pt describes her pain as a tightness. She has applied ice to the area with some relief of swelling. No worsening or alleviating factors noted of her pain. She is allergic to Metrogel. No other antibiotic allergies. Denies fever, numbness, swelling to the right leg. She reports suffering from anxiety, she took Benadryl and two Ativans prior to arrival but still feels very anxious  Past Medical History  Diagnosis Date  . Chest pain     Eval ER 5/24 R/O normal ECG negative markers, ? anxiety  . Acne   . Broken collarbone   . Dislocated shoulder     right   . Complication of anesthesia     age 24ys, "could not breathe after anes started"   . Depressed    Past Surgical History  Procedure Laterality Date  . Rhinoplasty      age 12yrs  . Bankhart repair shoulder    . Knee arthroplasty    . Laser of vulva    . Uterine ablation     Family History  Problem Relation Age of Onset  . Coronary artery disease Neg Hx     premature   Social History  Substance Use Topics  . Smoking status: Former Research scientist (life sciences)  . Smokeless tobacco: None  . Alcohol Use: Yes      Comment: rarely   OB History    Gravida Para Term Preterm AB TAB SAB Ectopic Multiple Living   2 2        2      Review of Systems  Constitutional: Negative for fever.  Skin:       +area of pain, swelling and erythema to left lateral ankle  Neurological: Negative for numbness.  All other systems reviewed and are negative.  Allergies  Metronidazole  Home Medications   Prior to Admission medications   Medication Sig Start Date End Date Taking? Authorizing Provider  Aspirin-Salicylamide-Caffeine (BC HEADACHE POWDER PO) Take 1 Package by mouth daily. headache    Historical Provider, MD  ciprofloxacin (CIPRO) 500 MG tablet Take 1 tablet (500 mg total) by mouth 2 (two) times daily. 12/20/11   Seabron Spates, CNM  desvenlafaxine (PRISTIQ) 50 MG 24 hr tablet Take 50 mg by mouth daily.    Historical Provider, MD  doxycycline (VIBRAMYCIN) 100 MG capsule Take 1 capsule (100 mg total) by mouth 2 (two) times daily. 06/24/15   [name], PA-C  HYDROcodone-acetaminophen (NORCO/VICODIN) 5-325 MG tablet Take 2 tablets by mouth every 4 (four) hours as needed. 12/21/14   [name], MD  ibuprofen (ADVIL,MOTRIN) 800 MG tablet Take 1 tablet (  800 mg total) by mouth 3 (three) times daily. 12/21/14   [name], MD  LORazepam (ATIVAN) 0.5 MG tablet Take 0.5 mg by mouth at bedtime as needed. sleep    Historical Provider, MD  minocycline (MINOCIN,DYNACIN) 50 MG capsule Take 50 mg by mouth 2 (two) times daily.    Historical Provider, MD  oxyCODONE-acetaminophen (PERCOCET) 10-325 MG per tablet Take 1 tablet by mouth every 4 (four) hours as needed for pain. 12/04/11   [name], MD   BP 157/103 mmHg  Pulse 117  Temp(Src) 97.7 F (36.5 C) (Oral)  Resp 20  Ht 5\' 6"  (1.676 m)  Wt 79.379 kg  BMI 28.26 kg/m2  SpO2 100% Physical Exam  Constitutional: She is oriented to person, place, and time. She appears well-developed and well-nourished.  HENT:  Head: Normocephalic and atraumatic.  Eyes:  Conjunctivae are normal.  Cardiovascular: Normal rate.   Pulmonary/Chest: Effort normal. No respiratory distress.  Abdominal: She exhibits no distension.  Musculoskeletal: Normal range of motion.  Neurological: She is alert and oriented to person, place, and time.  Skin: Skin is warm and dry.  Small amount of swelling to her left lower extremity. No erythema, induration, tenderness, or skin change. Intact sensation, normal pedal pulses   Psychiatric: She has a normal mood and affect. Her behavior is normal.  Nursing note and vitals reviewed.   ED Course  Procedures (including critical care time) DIAGNOSTIC STUDIES: Oxygen Saturation is 100% on RA, normal by my interpretation.    COORDINATION OF CARE: 8:32 PM Discussed treatment plan with pt at bedside which includes antibiotics and pt agreed to plan.    MDM   Final diagnoses:  Insect bite    Patient concerned about spider bite. Her symptoms are very mild and no sign of insect bite seen. She is very anxious and has been bite by a brown recluse as a kid and is very concerned about infection. Will cover her with doxy and strict return to ED precautions.   Afebrile. No  hypotension or other symptoms suggestive of severe infection. pt advised to follow up for worsening symptoms. Will discharge with Doxycycline. Return precautions discussed. Pt appears safe for discharge.   I personally performed the services described in this documentation, which was scribed in my presence. The recorded information has been reviewed and is accurate.   [name], PA-C 06/24/15 [place], DO 06/24/15 2110

## 2016-08-28 ENCOUNTER — Other Ambulatory Visit: Payer: Self-pay | Admitting: General Surgery

## 2016-08-28 NOTE — H&P (Signed)
History of Present Illness Danielle Ruff MD; 07/09/9561 8:56 AM) The patient is a 51 year old female who presents with a complaint of anal problems. 51 year old female who presents to the office for evaluation of an anal skin tag. She has noticed this for many years. She has a history of laser ablation for precancerous lesions and is concerned that this may be another lesion like that. She denies any rectal bleeding. She has not had a screening colonoscopy. Her bowel habits are regular, and she takes a fiber supplement daily. She denies any straining.   Past Surgical History Danielle Krueger, [place]; 08/28/2016 8:44 AM) Hysterectomy (not due to cancer) - Complete  Knee Surgery  Left. Oral Surgery  Shoulder Surgery  Right.  Diagnostic Studies History Danielle Krueger, [place]; 08/28/2016 8:44 AM) Colonoscopy  never Mammogram  1-3 years ago Pap Smear  1-5 years ago  Allergies Danielle Krueger, CMA; 08/28/2016 8:45 AM) MetroNIDAZOLE *DERMATOLOGICALS*  Allergies Reconciled   Medication History Danielle Krueger, CMA; 08/28/2016 8:45 AM) Progesterone Micronized (100MG  Capsule, Oral) Active. Doxycycline Hyclate (100MG  Capsule, Oral) Active. CloNIDine HCl (0.1MG  Tablet, Oral) Active. Desvenlafaxine Succinate ER (100MG  Tablet ER 24HR, Oral) Active. Diethylpropion HCl ER (75MG  Tablet ER 24HR, Oral) Active. LORazepam (0.5MG  Tablet, Oral) Active. Baclofen (10MG  Tablet, Oral) Active. Baclofen (20MG  Tablet, Oral) Active. Medications Reconciled  Social History Danielle Krueger, [place]; 08/28/2016 8:44 AM) Alcohol use  Occasional alcohol use. Caffeine use  Carbonated beverages, Coffee, Tea. No drug use  Tobacco use  Former smoker.  Family History Danielle Krueger, [place]; 08/28/2016 8:44 AM) Alcohol Abuse  Brother, Son. Anesthetic complications  Family Members In General. Arthritis  Family Members In General, Father, Mother. Breast Cancer  Mother. Cancer   Brother. Cerebrovascular Accident  Father. Colon Polyps  Family Members In General. Depression  Brother, Family Members In [place], Father, Son. Heart Disease  Father, Mother. Heart disease in female family member before age 47  Heart disease in female family member before age 53  Hypertension  Father, Mother. Migraine Headache  Mother, Son. Thyroid problems  Father.  Pregnancy / Birth History Danielle Krueger, [place]; 08/28/2016 8:44 AM) Age at menarche  67 years. Age of menopause  66-50 Contraceptive History  Intrauterine device, Oral contraceptives. Gravida  2 Length (months) of breastfeeding  3-6 Maternal age  5-30 Para  2  Other Problems Danielle Krueger, [place]; 08/28/2016 8:44 AM) Anxiety Disorder  Asthma  Back Pain  Cervical Cancer  Chest pain  Depression  Gastroesophageal Reflux Disease  General anesthesia - complications  Heart murmur  Hemorrhoids  Migraine Headache  Oophorectomy     Review of Systems Danielle Krueger CMA; 08/28/2016 8:44 AM) General Not Present- Appetite Loss, Chills, Fatigue, Fever, Night Sweats, Weight Gain and Weight Loss. Skin Present- Dryness. Not Present- Change in Wart/Mole, Hives, Jaundice, New Lesions, Non-Healing Wounds, Rash and Ulcer. HEENT Present- Seasonal Allergies and Wears glasses/contact lenses. Not Present- Earache, Hearing Loss, Hoarseness, Nose Bleed, Oral Ulcers, Ringing in the Ears, Sinus Pain, Sore Throat, Visual Disturbances and Yellow Eyes. Respiratory Present- Snoring. Not Present- Bloody sputum, Chronic Cough, Difficulty Breathing and Wheezing. Cardiovascular Not Present- Chest Pain, Difficulty Breathing Lying Down, Leg Cramps, Palpitations, Rapid Heart Rate, Shortness of Breath and Swelling of Extremities. Gastrointestinal Present- Excessive gas and Hemorrhoids. Not Present- Abdominal Pain, Bloating, Bloody Stool, Change in Bowel Habits, Chronic diarrhea, Constipation, Difficulty Swallowing, Gets full  quickly at meals, Indigestion, Nausea, Rectal Pain and Vomiting. Female Genitourinary Present- Frequency. Not Present- Nocturia, Painful Urination, Pelvic Pain and Urgency.  Musculoskeletal Present- Joint Stiffness. Not Present- Back Pain, Joint Pain, Muscle Pain, Muscle Weakness and Swelling of Extremities. Neurological Present- Numbness. Not Present- Decreased Memory, Fainting, Headaches, Seizures, Tingling, Tremor, Trouble walking and Weakness. Psychiatric Present- Anxiety. Not Present- Bipolar, Change in Sleep Pattern, Depression, Fearful and Frequent crying. Endocrine Present- Hot flashes. Not Present- Cold Intolerance, Excessive Hunger, Hair Changes, Heat Intolerance and New Diabetes.  Vitals Danielle Krueger CMA; 08/28/2016 8:46 AM) 08/28/2016 8:45 AM Weight: 169 lb Height: 66in Body Surface Area: 1.86 m Body Mass Index: 27.28 kg/m  Temp.: 98.62F  Pulse: 84 (Regular)  BP: 110/70 (Sitting, Left Arm, Standard)       Physical Exam Danielle Ruff MD; 09/06/6331 9:03 AM) General Mental Status-Alert. General Appearance-Not in acute distress. Build & Nutrition-Well nourished. Posture-Normal posture. Gait-Normal.  Head and Neck Head-normocephalic, atraumatic with no lesions or palpable masses. Trachea-midline.  Chest and Lung Exam Chest and lung exam reveals -on auscultation, normal breath sounds, no adventitious sounds and normal vocal resonance.  Cardiovascular Cardiovascular examination reveals -normal heart sounds, regular rate and rhythm with no murmurs and no digital clubbing, cyanosis, edema, increased warmth or tenderness.  Abdomen Inspection Inspection of the abdomen reveals - No Hernias. Palpation/Percussion Palpation and Percussion of the abdomen reveal - Soft, Non Tender, No Rigidity (guarding), No hepatosplenomegaly and No Palpable abdominal masses.  Neurologic Neurologic evaluation reveals -alert and oriented x 3 with no impairment  of recent or remote memory, normal attention span and ability to concentrate, normal sensation and normal coordination.  Musculoskeletal Normal Exam - Bilateral-Upper Extremity Strength Normal and Lower Extremity Strength Normal.   Results Danielle Ruff MD; 5/45/6256 9:08 AM) Procedures  Name Value Date ANOSCOPY, DIAGNOSTIC (38937) [ Hemorrhoids ] Procedure Other: Procedure: Anoscopy Surgeon: Marcello Moores After the risks and benefits were explained, verbal consent was obtained for above procedure. A medical assistant chaperone was present thoroughout the entire procedure. Anesthesia: none Diagnosis: anal skin tag Findings: anterior anal tag, small. skin appears thickened and discolored  Performed: 08/28/2016 9:06 AM    Assessment & Plan Danielle Ruff MD; 3/42/8768 9:06 AM) ANAL SKIN TAG (K64.4) Impression: 51 year old female with abnormal appearing anterior skin tag. I have recommended excision here in the office. We will set this up within the next few weeks under local anesthesia. Current Plans ANOSCOPY, DIAGNOSTIC (11572) SCREENING FOR COLORECTAL CANCER (Z12.11) Impression: Patient has not had a colonoscopy. She appears to be average risk. We will get this scheduled approximate 1 month after anal skin tag removal. Current Plans Pt Education - [facility] Colonoscopy (AT)

## 2016-11-26 ENCOUNTER — Encounter: Payer: Self-pay | Admitting: Neurology

## 2016-11-27 ENCOUNTER — Ambulatory Visit (INDEPENDENT_AMBULATORY_CARE_PROVIDER_SITE_OTHER): Payer: BLUE CROSS/BLUE SHIELD | Admitting: Neurology

## 2016-11-27 ENCOUNTER — Encounter: Payer: Self-pay | Admitting: Neurology

## 2016-11-27 VITALS — BP 110/81 | HR 73 | Ht 66.0 in | Wt 167.0 lb

## 2016-11-27 DIAGNOSIS — R0683 Snoring: Secondary | ICD-10-CM | POA: Diagnosis not present

## 2016-11-27 DIAGNOSIS — G4750 Parasomnia, unspecified: Secondary | ICD-10-CM | POA: Diagnosis not present

## 2016-11-27 DIAGNOSIS — F5104 Psychophysiologic insomnia: Secondary | ICD-10-CM

## 2016-11-27 DIAGNOSIS — F5109 Other insomnia not due to a substance or known physiological condition: Secondary | ICD-10-CM | POA: Diagnosis not present

## 2016-11-27 DIAGNOSIS — G4737 Central sleep apnea in conditions classified elsewhere: Secondary | ICD-10-CM | POA: Diagnosis not present

## 2016-11-27 DIAGNOSIS — F514 Sleep terrors [night terrors]: Secondary | ICD-10-CM

## 2016-11-27 DIAGNOSIS — F519 Sleep disorder not due to a substance or known physiological condition, unspecified: Secondary | ICD-10-CM

## 2016-11-27 NOTE — Patient Instructions (Signed)
Please remember to try to maintain good sleep hygiene, which means: Keep a regular sleep and wake schedule, try not to exercise or have a meal within 2 hours of your bedtime, try to keep your bedroom conducive for sleep, that is, cool and dark, without light distractors such as an illuminated alarm clock, and refrain from watching TV right before sleep or in the middle of the night and do not keep the TV or radio on during the night. Also, try not to use or play on electronic devices at bedtime, such as your cell phone, tablet PC or laptop. If you like to read at bedtime on an electronic device, try to dim the background light as much as possible. Do not eat in the middle of the night.   We will request a sleep study with EEG montage to also address night terrors, tourette related tics, etc. We will look for leg twitching and snoring or sleep apnea aside form parasomnia .   For chronic insomnia, you are best followed by a psychiatrist and/or sleep psychologist.   We will call you with the sleep study results and make a follow up appointment if needed.

## 2016-11-27 NOTE — Progress Notes (Signed)
SLEEP MEDICINE CLINIC   Provider:  Larey Seat, M D  Primary Care Physician:  Chesley Noon, MD   Referring Provider: Chesley Noon, MD    Chief Complaint  Patient presents with  . New Patient (Initial Visit)    pt alone, room 10. pt presents with some tiredness comes and goes. pt husband notices that she snores and stops breathing in her sleep. pt gasps for air. parents have sleep apnea.     HPI:  Danielle Krueger is a 51 y.o.caucasian, married , right handed female , seen here as in a referral from Dr. Melford Aase for the evaluation of sleep apnea. She described her husbands observation of "horrible snoring" , difficult time waking up in the morning , but fragmented sleep with up to 3 times waking up sitting up and fighting for air. She has trouble to exhale not inhale ! She has used Ativan as a sleep aid for over 8 years.   Sleep habits are as follows: Danielle Krueger will watch TV for 1 hour maybe 1-1/2 hours in her bedroom and usually has a TV in the background when she sleeps. Her bedroom is described as cool at about 71F with a ceiling fan running. Due to the TV it is neither quiet nor dark. Danielle Krueger shares a bedroom and she states that her husband, Danielle Krueger, is also wanting the TV in the background. The couple uses a sleep number bed, but it does not have an adjustable height. She sleeps usually on 2 pillows one a body pillow one is for head support, she sleeps on her side and avoiding the shoulder, she ends up in supine position.  She does not have trouble initiating sleep when she takes Ativan, and she has done so regularly. Most nights she is asleep by midnight but she is already in bed between 10 and 11 PM. There are rare nights where she will be up in spite of medication until the early morning hours. Usually she can sleep through them until she is woken by one of her choking attacks. She will then sit up and tries to catch her breath. She usually can go back  to sleep after that. She rises in the morning at about 6:30 after hitting the snooze button up to 8 times. [name] does not nap in daytime.  Sleep medical history and family sleep history: Mother has sleep apnea, is in the process form changing from CPAP to BiPAP. Father deceased , had horrific sleep apnea. Used CPAP, but had CHF. Brother had alcoholism, mother has problems, too.   Patient has undergone hysterectomy in 2016, and septoplasty in  1984 and again in1988. No tonsillectomy, sleep walking in childhood, night terrors. Still has some night terrors now at 51 ! Tourette syndrome with clearing her throat- father had it, facial tics.Dx by Dr. Earley Favor in 2nd grade.   Social history: married, adult children - 6, some step children. Marland Kitchen, her son, has Asperger's and is bipolar. He is in college. She is in  full time administration-work daytime , in an office. No window. No tobacco use now , quit in 2012.  Alcohol- seldomly, Caffeine : 1 mug in AM, one for the road, 2 diet cokes before Lunch.    Review of Systems: Out of a complete 14 system review, the patient complains of only the following symptoms, and all other reviewed systems are negative. Depression, anxiety, chronic benzodiazepine.   Epworth score  9-11 , Fatigue severity  score 31, depression score 4/15   Social History   Social History  . Marital status: Married    Spouse name: N/A  . Number of children: N/A  . Years of education: N/A   Occupational History  . Not on file.   Social History Main Topics  . Smoking status: Former Smoker    Packs/day: 1.00    Years: 15.00  . Smokeless tobacco: Never Used  . Alcohol use Yes     Comment: rarely  . Drug use: No  . Sexual activity: Not on file   Other Topics Concern  . Not on file   Social History Narrative   6 children combined remarriage. Works for [employer], commissioning. Wants to work out at [employer].     Family History  Problem Relation Age of Onset  . Coronary  artery disease Neg Hx        premature    Past Medical History:  Diagnosis Date  . Acne   . Broken collarbone   . Chest pain    Eval ER 5/24 R/O normal ECG negative markers, ? anxiety  . Complication of anesthesia    age 76ys, "could not breathe after anes started"   . Depressed   . Dislocated shoulder    right     Past Surgical History:  Procedure Laterality Date  . BANKHART REPAIR SHOULDER    . KNEE ARTHROPLASTY    . laser of vulva    . RHINOPLASTY     age 40yrs  . uterine ablation      Current Outpatient Prescriptions  Medication Sig Dispense Refill  . baclofen (LIORESAL) 20 MG tablet   0  . ciprofloxacin (CIPRO) 500 MG tablet Take 1 tablet (500 mg total) by mouth 2 (two) times daily. 14 tablet 0  . clindamycin (CLINDAGEL) 1 % gel apply to affected area twice a day as directed  0  . cloNIDine (CATAPRES) 0.1 MG tablet take 3 tablets by mouth three times a day if needed  0  . desvenlafaxine (PRISTIQ) 100 MG 24 hr tablet Take 50 mg by mouth daily.    . Diethylpropion HCl CR 75 MG TB24 Take 1 tablet by mouth every morning.  0  . LORazepam (ATIVAN) 0.5 MG tablet Take 0.5 mg by mouth at bedtime as needed. sleep    . pantoprazole (PROTONIX) 40 MG tablet   0   No current facility-administered medications for this visit.     Allergies as of 11/27/2016 - Review Complete 11/27/2016  Allergen Reaction Noted  . Metronidazole Rash     Vitals: BP 110/81   Pulse 73   Ht 5\' 6"  (1.676 m)   Wt 167 lb (75.8 kg)   BMI 26.95 kg/m  Last Weight:  Wt Readings from Last 1 Encounters:  11/27/16 167 lb (75.8 kg)   GNF:AOZH mass index is 26.95 kg/m.     Last Height:   Ht Readings from Last 1 Encounters:  11/27/16 5\' 6"  (1.676 m)   Physical exam: General: The patient is awake, alert and appears not in acute distress. The patient is well groomed. Head: Normocephalic, atraumatic. Neck is supple. Mallampati 1   ,  neck circumference:14.25 ". Nasal airflow restricted , TMJ click and  pain is evident . Retrognathia is not seen.  Cardiovascular:  Regular rate and rhythm, without  murmurs or carotid bruit, and without distended neck veins. Respiratory: Lungs are clear to auscultation. Skin:  Without evidence of edema, or rash Trunk: BMI is  27. The patient's posture is erect.  Neurologic exam : The patient is awake and alert, oriented to place and time.    Attention span & concentration ability appears normal.  Speech is fluent,  without dysarthria, dysphonia , she speaks slowly- some word finding is delayed / aphasia.  Mood and affect are appropriate.  Cranial nerves: Pupils are equal and briskly reactive to light. Funduscopic exam without evidence of pallor or edema. Extraocular movements  in vertical and horizontal planes intact and without nystagmus. Visual fields by finger perimetry are intact. Hearing to finger rub reduced.   Facial sensation intact to fine touch.  Facial motor strength is symmetric and tongue and uvula move midline. Shoulder shrug was symmetrical.   Motor exam:   Normal tone, muscle bulk and symmetric strength in all extremities. Sensory:  Fine touch, pinprick and vibration were tested in all extremities. Proprioception tested in the upper extremities was normal. Coordination: Rapid alternating movements in the fingers/hands was normal. Finger-to-nose maneuver  normal without evidence of ataxia, dysmetria or tremor. Gait and station: Patient walks without assistive device and is able unassisted to climb up to the exam table. Strength within normal limits.  Stance is stable and normal.  3 step Turn . Deep tendon reflexes: in the  upper and lower extremities are symmetric and intact. Babinski maneuver response is  downgoing.    Assessment:  After physical and neurologic examination, review of laboratory studies,  Personal review of imaging studies, reports of other /same  Imaging studies, results of polysomnography and / or neurophysiology testing and  pre-existing records as far as provided in visit., my assessment is :  Primarily problems to exhale at night, loud snoring, will check for apnea and especially central apnea with f gasping for air. There is Upper Airway Resistance with nasal congestion- today severe.   1) night terrors- reduced under ativan.   2) insomnia with delayed sleep onset, prolonged latency, poor sleep hygiene.   3) Tourette syndrome, controlled somewhat on Clonidine and Baclofen.    The patient was advised of the nature of the diagnosed disorder , the treatment options and the  risks for general health and wellness arising from not treating the condition.   I spent more than 50 minutes of face to face time with the patient.  Greater than 50% of time was spent in counseling and coordination of care. We have discussed the diagnosis and differential and I answered the patient's questions.    Plan:  Treatment plan and additional workup : Attended parasomnia montage- will ask for permission to break off and turn to SPLIT is OSA is seen. I lie for her to sleep supine and bring her Ativan with her.    Larey Seat, MD 0/27/7412, 8:78 AM  Certified in Neurology by ABPN Certified in [place] by [facility] [address], [place],  [address]

## 2016-12-09 ENCOUNTER — Other Ambulatory Visit: Payer: Self-pay | Admitting: Obstetrics and Gynecology

## 2016-12-09 DIAGNOSIS — Z1231 Encounter for screening mammogram for malignant neoplasm of breast: Secondary | ICD-10-CM

## 2016-12-18 ENCOUNTER — Ambulatory Visit
Admission: RE | Admit: 2016-12-18 | Discharge: 2016-12-18 | Disposition: A | Discharge status: Home / Self Care | Payer: BLUE CROSS/BLUE SHIELD | Source: Ambulatory Visit | Attending: Obstetrics and Gynecology | Admitting: Obstetrics and Gynecology

## 2016-12-18 DIAGNOSIS — Z1231 Encounter for screening mammogram for malignant neoplasm of breast: Secondary | ICD-10-CM

## 2017-04-06 ENCOUNTER — Telehealth: Payer: Self-pay | Admitting: Neurology

## 2017-04-07 NOTE — Telephone Encounter (Signed)
We have attempted to call the patient two times to schedule sleep study. Patient has been unavailable at the phone numbers we have on file, and has not returned our calls. At this time, we will send a letter asking patient to please contact the sleep lab.  °

## 2017-10-09 ENCOUNTER — Other Ambulatory Visit: Payer: Self-pay | Admitting: Obstetrics and Gynecology

## 2018-01-06 ENCOUNTER — Telehealth: Payer: Self-pay

## 2018-01-06 NOTE — Telephone Encounter (Signed)
Pt just called sleep lab stating she's ready to schedule sleep study that was ordered in 11/2016. It has been over a year and that order has expired.  Pt needs to schedule a office visit with Dohmeier.  Pt will be calling main number to get that appt schedule.

## 2018-01-25 ENCOUNTER — Other Ambulatory Visit: Payer: Self-pay | Admitting: Obstetrics and Gynecology

## 2018-01-25 DIAGNOSIS — Z1231 Encounter for screening mammogram for malignant neoplasm of breast: Secondary | ICD-10-CM

## 2018-03-12 ENCOUNTER — Ambulatory Visit
Admission: RE | Admit: 2018-03-12 | Discharge: 2018-03-12 | Disposition: A | Discharge status: Home / Self Care | Payer: BLUE CROSS/BLUE SHIELD | Source: Ambulatory Visit | Attending: Obstetrics and Gynecology | Admitting: Obstetrics and Gynecology

## 2018-03-12 DIAGNOSIS — Z1231 Encounter for screening mammogram for malignant neoplasm of breast: Secondary | ICD-10-CM

## 2018-05-30 ENCOUNTER — Telehealth: Payer: Self-pay | Admitting: *Deleted

## 2018-05-30 NOTE — Telephone Encounter (Signed)
Left patient message on voicemail that our office will be closed, on 05/31/2018, to disinfect.  Informed that we will call back to reschedule the appointment.

## 2018-05-31 ENCOUNTER — Ambulatory Visit: Payer: BLUE CROSS/BLUE SHIELD | Admitting: Neurology

## 2018-05-31 ENCOUNTER — Encounter

## 2018-06-21 ENCOUNTER — Telehealth: Payer: Self-pay | Admitting: Neurology

## 2018-06-21 NOTE — Telephone Encounter (Signed)
Called the patient to verify that she was still good with Dr Brett Fairy for her apt 06/22/18 at 9:30 am. Questioned if the patient had a chance to download the app and get our email. Patient did not at this time. I advised to make sure she has downloaded the app this afternoon so that she can be ready for the apt tomorrow. Advised the patient I will send a new email for her to join meeting on . Pt verbalized understanding. Attempted to review the patient's chart with her prior to the apt. Patient states that she would have to call me back. Patient gave verbal consent to complete the video visit.

## 2018-06-22 ENCOUNTER — Ambulatory Visit (INDEPENDENT_AMBULATORY_CARE_PROVIDER_SITE_OTHER): Payer: BLUE CROSS/BLUE SHIELD | Admitting: Neurology

## 2018-06-22 ENCOUNTER — Other Ambulatory Visit: Payer: Self-pay

## 2018-06-22 ENCOUNTER — Encounter: Payer: Self-pay | Admitting: Neurology

## 2018-06-22 DIAGNOSIS — F5109 Other insomnia not due to a substance or known physiological condition: Secondary | ICD-10-CM | POA: Diagnosis not present

## 2018-06-22 DIAGNOSIS — F519 Sleep disorder not due to a substance or known physiological condition, unspecified: Secondary | ICD-10-CM

## 2018-06-22 DIAGNOSIS — R0683 Snoring: Secondary | ICD-10-CM | POA: Diagnosis not present

## 2018-06-22 DIAGNOSIS — F514 Sleep terrors [night terrors]: Secondary | ICD-10-CM | POA: Diagnosis not present

## 2018-06-22 DIAGNOSIS — R079 Chest pain, unspecified: Secondary | ICD-10-CM

## 2018-06-22 DIAGNOSIS — F5104 Psychophysiologic insomnia: Secondary | ICD-10-CM

## 2018-06-22 NOTE — Telephone Encounter (Signed)
Patient called back this morning and I was able to review her chart with her and make sure everything is up to date. Patient went ahead and gave me the answers to the sleep scale and neck circumference measurement.   1) sitting and reading-2 2) watching TV-2 3) sitting inactive in a public place (ex meeting or theater)-1 4) as a passenger in a car for an hour without a break- 3 5) lying down to rest in the afternoon-3 6) sitting and talking to someone-1 7) sitting quietly after lunch (when you've had no alcohol)-1 8) in a car, while stopped in traffic-0  Total 13  Neck size- was 15 3/4in Will provide this for Dr Brett Fairy for their apt today.pt states that she has downloaded the app and has the e-mail and will log on shortly before.

## 2018-06-27 ENCOUNTER — Encounter: Payer: Self-pay | Admitting: Neurology

## 2018-06-27 DIAGNOSIS — R0683 Snoring: Secondary | ICD-10-CM | POA: Insufficient documentation

## 2018-06-27 DIAGNOSIS — F519 Sleep disorder not due to a substance or known physiological condition, unspecified: Secondary | ICD-10-CM | POA: Insufficient documentation

## 2018-06-27 DIAGNOSIS — F5109 Other insomnia not due to a substance or known physiological condition: Secondary | ICD-10-CM | POA: Insufficient documentation

## 2018-06-27 DIAGNOSIS — F5104 Psychophysiologic insomnia: Secondary | ICD-10-CM | POA: Insufficient documentation

## 2018-06-27 NOTE — Patient Instructions (Signed)
Sleep Studies A sleep study (polysomnogram) is a series of tests done while you are sleeping. A sleep study records your brain waves, heart rate, breathing rate, oxygen level, and eye and leg movements. A sleep study helps your health care provider:  See how well you sleep.  Diagnose a sleep disorder.  Determine how severe your sleep disorder is.  Create a plan to treat your sleep disorder. Your health care provider may recommend a sleep study if you:  Feel sleepy on most days.  Snore loudly while sleeping.  Have unusual behaviors while you sleep, such as walking.  Have brief periods in which you stop breathing during sleep (sleepapnea).  Fall asleep suddenly during the day (narcolepsy).  Have trouble falling asleep or staying asleep (insomnia).  Feel like you need to move your legs when trying to fall asleep (restless legs syndrome).  Move your legs by flexing and extending them regularly while asleep (periodic limb movement disorder).  Act out your dreams while you sleep (sleep behavior disorder).  Feel like you cannot move when you first wake up (sleep paralysis). What tests are part of a sleep study? Most sleep studies record the following during sleep:  Brain activity.  Eye movements.  Heart rate and rhythm.  Breathing rate and rhythm.  Blood-oxygen level.  Blood pressure.  Chest and belly movement as you breathe.  Arm and leg movements.  Snoring or other noises.  Body position. Where are sleep studies done? Sleep studies are done at sleep centers. A sleep center may be inside a hospital, office, or clinic. The room where you have the study may look like a hospital room or a hotel room. The health care providers doing the study may come in and out of the room during the study. Most of the time, they will be in another room monitoring your test as you sleep. How are sleep studies done? Most sleep studies are done during a normal period of time for a full  night of sleep. You will arrive at the study center in the evening and go home in the morning. Before the test  Bring your pajamas and toothbrush with you to the sleep study.  Do not have caffeine on the day of your sleep study.  Do not drink alcohol on the day of your sleep study.  Your health care provider will let you know if you should stop taking any of your regular medicines before the test. During the test      Round, sticky patches with sensors attached to recording wires (electrodes) are placed on your scalp, face, chest, and limbs.  Wires from all the electrodes and sensors run from your bed to a computer. The wires can be taken off and put back on if you need to get out of bed to go to the bathroom.  A sensor is placed over your nose to measure airflow.  A finger clip is put on your finger or ear to measure your blood oxygen level (pulse oximetry).  A belt is placed around your belly and a belt is placed around your chest to measure breathing movements.  If you have signs of the sleep disorder called sleep apnea during your test, you may get a treatment mask to wear for the second half of the night. ? The mask provides positive airway pressure (PAP) to help you breathe better during sleep. This may greatly improve your sleep apnea. ? You will then have all tests done again with the mask   you may get a treatment mask to wear for the second half of the night.  ? The mask provides positive airway pressure (PAP) to help you breathe better during sleep. This may greatly improve your sleep apnea.  ? You will then have all tests done again with the mask in place to see if your measurements and recordings change.  After the test  · A medical doctor who specializes in sleep will evaluate the results of your sleep study and share them with you and your primary health care provider.  · Based on your results, your medical history, and a physical exam, you may be diagnosed with a sleep disorder, such as:  ? Sleep apnea.  ? Restless legs syndrome.  ? Sleep-related behavior disorder.  ? Sleep-related movement disorders.  ? Sleep-related seizure disorders.  · Your health care team will help determine your treatment options based on your diagnosis. This  may include:  ? Improving your sleep habits (sleep hygiene).  ? Wearing a continuous positive airway pressure (CPAP) or bi-level positive airway pressure (BPAP) mask.  ? Wearing an oral device at night to improve breathing and reduce snoring.  ? Taking medicines.  Follow these instructions at home:  · Take over-the-counter and prescription medicines only as told by your health care provider.  · If you are instructed to use a CPAP or BPAP mask, make sure you use it nightly as directed.  · Make any lifestyle changes that your health care provider recommends.  · If you were given a device to open your airway while you sleep, use it only as told by your health care provider.  · Do not use any tobacco products, such as cigarettes, chewing tobacco, and e-cigarettes. If you need help quitting, ask your health care provider.  · Keep all follow-up visits as told by your health care provider. This is important.  Summary  · A sleep study (polysomnogram) is a series of tests done while you are sleeping. It shows how well you sleep.  · Most sleep studies are done over one full night of sleep. You will arrive at the study center in the evening and go home in the morning.  · If you have signs of the sleep disorder called sleep apnea during your test, you may get a treatment mask to wear for the second half of the night.  · A medical doctor who specializes in sleep will evaluate the results of your sleep study and share them with your primary health care provider.  This information is not intended to replace advice given to you by your health care provider. Make sure you discuss any questions you have with your health care provider.  Document Released: 09/07/2002 Document Revised: 03/31/2017 Document Reviewed: 03/31/2017  Elsevier Interactive Patient Education © 2019 Elsevier Inc.

## 2018-06-27 NOTE — Progress Notes (Signed)
SLEEP MEDICINE CLINIC   Provider:  Larey Seat, MD  Primary Care Physician:  Danielle Noon, MD   Virtual Visit via Video Note  I connected with Danielle Krueger on 06-22-2018 by a video enabled telemedicine application and verified that I am speaking with the correct person using two identifiers.   I discussed the limitations of evaluation and management by telemedicine and the availability of in person appointments. The patient expressed understanding and agreed to proceed.  Danielle Seat, MD  06-22-2018    HPI:  Danielle Krueger is a 53 y.o.caucasian, married , right handed female , and she has been seen here  in a referral from Danielle Krueger for the evaluation of sleep apnea on 11-27-2016. She had described her husbands observation of "horrible snoring" , difficult time waking up in the morning , but fragmented sleep with up to 3 times waking up sitting up and fighting for air. She has trouble to exhale not inhale ! She has used Ativan as a sleep aid for over 8 years. She now presents over 18 month after her initial visit as she has still not undergone an attended sleep study. She had undergone a HST which was not giving answers to her reported problems with organic parasomnia activity. Below are the notes and information I gathered at our last visit in person.     Sleep habits are as follows:Danielle Krueger will watch TV for 1 hour maybe 1-1/2 hours in her bedroom and usually has a TV in the background when she sleeps. Her bedroom is described as cool at about 68F with a ceiling fan running. Due to the TV it is neither quiet nor dark. Danielle Krueger shares a bedroom and she states that her husband, Danielle Krueger, is also wanting the TV in the background. The couple uses a sleep number bed, but it does not have an adjustable height. She sleeps usually on 2 pillows one a body pillow one is for head support, she sleeps on her side and avoiding the shoulder, she ends up in supine  position.  She does not have trouble initiating sleep when she takes Ativan, and she has done so regularly. Most nights she is asleep by midnight but she is already in bed between 10 and 11 PM. There are rare nights where she will be up in spite of medication until the early morning hours. Usually she can sleep through them until she is woken by one of her choking attacks. She will then sit up and tries to catch her breath. She usually can go back to sleep after that. She rises in the morning at about 6:30 after hitting the snooze button up to 8 times. Danielle Krueger does not nap in daytime.  Sleep medical history : Tourette syndrome, facial tics. Trouble to exhale, horrible snoring. Night terrors.    Family sleep history: Mother has sleep apnea, is in the process form changing from CPAP to BiPAP. Father deceased , Danielle Krueger was a patient here,  had horrific sleep apnea. Used CPAP, but had CHF.  Brother had alcoholism, mother has abuse problems, too.   Patient has undergone hysterectomy in 2016, and septoplasty in  1984 and again in1988. No tonsillectomy, sleep walking in childhood, night terrors. Still has some night terrors now at  Danielle Krueger !  Tourette syndrome with clearing her throat- father had it, facial tics.Dx by Danielle Krueger in 2nd grade.   Social history: married, adult children - 6, some of  them step children. Marland Kitchen, her biological son, has Asperger's and is bipolar. He is now home from college during the [place] 19 crisis. 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  No window. No tobacco use now , quit in 2012.  Alcohol- seldomly ( 1 in 6 month ) ,  Caffeine : 1 mug in AM, one for the road, 2 diet cokes a week, less than in our last visit.   Review of Systems: Out of a complete 14 system review, the patient complains of only the following symptoms, and all other reviewed systems are negative. Depression, anxiety, chronic benzodiazepine.   How likely are  you to doze in the following situations: 0 = not likely, 1 = slight chance, 2 = moderate chance, 3 = high chance  Sitting and Reading? Watching Television? Sitting inactive in a public place (theater or meeting)? Lying down in the afternoon when circumstances permit? Sitting and talking to someone? Sitting quietly after lunch without alcohol? In a car, while stopped for a few minutes in traffic? As a passenger in a car for an hour without a break?  Total =1/ 24 on [id number] ,  Fatigue severity score 32/ 64,  depression score was not repeated.    Social History   Socioeconomic History   Marital status: Married    Spouse name: Not on file   Number of children: Not on file   Years of education: Not on file   Highest education level: Not on file  Occupational History   Not on file  Social Needs   Financial resource strain: Not on file   Food insecurity:    Worry: Not on file    Inability: Not on file   Transportation needs:    Medical: Not on file    Non-medical: Not on file  Tobacco Use   Smoking status: Former Smoker    Packs/day: 1.00    Years: 15.00    Pack years: 15.00    Last attempt to quit: 2011    Years since quitting: 9.2   Smokeless tobacco: Never Used  Substance and Sexual Activity   Alcohol use: Yes    Comment: Very rare   Drug use: No   Sexual activity: Not on file  Lifestyle   Physical activity:    Days per week: Not on file    Minutes per session: Not on file   Stress: Not on file  Relationships   Social connections:    Talks on phone: Not on file    Gets together: Not on file    Attends religious service: Not on file    Active member of club or organization: Not on file    Attends meetings of clubs or organizations: Not on file    Relationship status: Not on file   Intimate partner violence:    Fear of current or ex partner: Not on file    Emotionally abused: Not on file    Physically abused: Not on file    Forced sexual  activity: Not on file  Other Topics Concern   Not on file  Social History Narrative   6 children combined remarriage. Works for [employer]. Wants to work out at [employer].     Family History  Problem Relation Danielle of Onset   Breast cancer Mother    Heart attack Mother    Sleep apnea Mother    Sleep apnea Father    Heart disease Father    Brain  cancer Brother    Coronary artery disease Neg Hx        premature    Past Medical History:  Diagnosis Date   Acne    Broken collarbone    Chest pain    Eval ER 5/24 R/O normal ECG negative markers, ? anxiety   Complication of anesthesia    Danielle 21ys, "could not breathe after anes started"    Depressed    Dislocated shoulder    right     Past Surgical History:  Procedure Laterality Date   ABDOMINAL HYSTERECTOMY  2016   [facility] REPAIR SHOULDER     KNEE ARTHROPLASTY     laser of vulva     RHINOPLASTY     Danielle 31yrs   uterine ablation      Current Outpatient Medications  Medication Sig Dispense Refill   acyclovir (ZOVIRAX) 400 MG tablet Take 400 mg by mouth 5 (five) times daily. prn     ampicillin (PRINCIPEN) 500 MG capsule TK 1 C PO BID     baclofen (LIORESAL) 20 MG tablet 20 mg 3 (three) times daily.   0   cloNIDine (CATAPRES) 0.1 MG tablet take 3 tablets by mouth three times a day if needed  0   desvenlafaxine (PRISTIQ) 100 MG 24 hr tablet Take 100 mg by mouth daily.      Diethylpropion HCl CR 75 MG TB24 Take 1 tablet by mouth every morning.  0   LORazepam (ATIVAN) 0.5 MG tablet Take 0.5 mg by mouth at bedtime as needed. sleep     pantoprazole (PROTONIX) 40 MG tablet Take 40 mg by mouth daily.   0   progesterone (PROMETRIUM) 100 MG capsule      rosuvastatin (CRESTOR) 10 MG tablet      Sulfacetamide Sodium-Sulfur 10-5 % CREA APPLY ON THE SKIN/FACE NIGHTLY.     VENTOLIN HFA 108 (90 Base) MCG/ACT inhaler INL 2 PFS PO Q 6 H PRF WHZ OR SOB     No current facility-administered medications for  this visit.     Allergies as of 06/22/2018 - Review Complete 06/22/2018  Allergen Reaction Noted   Metronidazole Rash     Vitals: There were no vitals taken for this visit. Last Weight:   Wt Readings from Last 1 Encounters:  11/27/16 167 lb (75.8 kg)   OHY:WVPXT is no height or weight on file to calculate BMI.  Last Height:    Ht Readings from Last 1 Encounters:  11/27/16 5\' 6"  (1.676 m)   Physical exam: General: The patient is awake, alert and appears not in acute distress. The patient is well groomed. Head: Normocephalic, atraumatic. Neck is supple. Mallampati 1   ,  neck circumference:15.75 ". Nasal airflow appears restricted , she reports TMJ click . Retrognathia is not seen.  Respiratory: no evidence of SOB. Skin:  Reports no evidence of edema, or rash Trunk: BMI is 27. The patient's posture is erect.  OBSERVATION:  The patient is awake and alert, oriented to place and time.    Attention span & concentration ability appears normal.  Speech is fluent, without dysarthria, dysphonia , she speaks clearly but slowly- some word finding may be delayed / aphasia. Mood and affect are appropriate. Cranial nerves: Pupils are equal . Facial motor strength is symmetric and tongue and uvula move midline. Shoulder shrug was symmetrical.  I did not witness a facial tic during this video guided conversation  Motor exam:   normal muscle bulk and symmetric ROM in  all extremities. Coordination: Movements in the fingers/hands was normal. Gait and station: Patient walks without assistive device.  Assessment:  After review of pre-existing records as far as provided., my assessment is :  Primarily problems to exhale at night, loud snoring, will check for apnea and especially central apnea with f gasping for air. There is Upper Airway Resistance with nasal congestion- today severe.   1) night terrors-  Going on for a decade now, initially reduced under ativan but still present.Marland Kitchen   2) insomnia  with delayed sleep onset, prolonged latency, she has worked on her sleep hygiene, TV, caffeine intake. There are sleep choking episodes, the percievd blocking of the airway when exhaling, a scary sensation that wakes her frequently.   3) Tourette syndrome, controlled somewhat on Clonidine and Baclofen.      Idiscussed the assessment and treatment plan with the patient. The patient was provided an opportunity to ask questions and all were answered. The patient agreed with the plan and demonstrated an understanding of the instructions.   The patient was advised to call back or seek an in-person evaluation if the symptoms worsen or if the condition fails to improve as anticipated.  I provided 20 minutes of non-face-to-face time during this encounter.    IPlan:  Treatment plan and additional workup : Her last visit here ended with a HST due to insurance non-coverage. She does need an attended parasomnia montage- EEG and video will need to be expanded. I We will ask for permission to convert  to SPLIT protocol if OSA is seen.     Danielle Seat, MD  [id number], 3:29 PM  Certified in Neurology by ABPN Certified in Sleep Medicine by [facility] [address] [place], [place] [address]   Follow Up Instructions:  Not using HST, this patient will need a parasomnia, expanded EEG montage for her attended sleep study.  Follow up in 2-3 month .     Lieutenant Diego, MD

## 2018-08-09 ENCOUNTER — Other Ambulatory Visit: Payer: Self-pay

## 2018-08-09 ENCOUNTER — Ambulatory Visit (INDEPENDENT_AMBULATORY_CARE_PROVIDER_SITE_OTHER): Payer: BLUE CROSS/BLUE SHIELD | Admitting: Neurology

## 2018-08-09 DIAGNOSIS — F519 Sleep disorder not due to a substance or known physiological condition, unspecified: Secondary | ICD-10-CM

## 2018-08-09 DIAGNOSIS — F514 Sleep terrors [night terrors]: Secondary | ICD-10-CM | POA: Diagnosis not present

## 2018-08-09 DIAGNOSIS — G4731 Primary central sleep apnea: Secondary | ICD-10-CM

## 2018-08-09 DIAGNOSIS — R079 Chest pain, unspecified: Secondary | ICD-10-CM

## 2018-08-09 DIAGNOSIS — R0683 Snoring: Secondary | ICD-10-CM

## 2018-08-09 DIAGNOSIS — F5104 Psychophysiologic insomnia: Secondary | ICD-10-CM

## 2018-08-09 DIAGNOSIS — F5109 Other insomnia not due to a substance or known physiological condition: Secondary | ICD-10-CM

## 2018-08-11 NOTE — Addendum Note (Signed)
Addended by: Larey Seat on: 08/11/2018 10:11 AM   Modules accepted: Orders

## 2018-08-11 NOTE — Procedures (Signed)
PATIENT'S NAME:  Danielle, Krueger DOB:      06-Aug-1965      MR#:    [id number]     DATE OF RECORDING: 08/09/2018 REFERRING M.D.:  Anastasia Pall, MD Study Performed:   Baseline Polysomnogram HISTORY:  Danielle Krueger is a 53 y.o. Caucasian, married and right handed female patient, and she has been seen here in a referral from Dr. Melford Aase for the evaluation of sleep apnea on 11-27-2016. She had described her husband's observation of "horrible snoring" , difficult time waking up in the morning , but fragmented sleep with waking up, sitting up and fighting for air up to 3 times at night. She has trouble to exhale not inhale! She has used Ativan as a sleep aid for over 8 years. She now presents over 18 month after her initial visit as she has still not undergone an attended sleep study. She had undergone a HST which was not giving answers to her reported problems with organic parasomnia activity. Below are the notes and information I gathered at our last visit in person.    Sleep habits are as follows: [name] will watch TV for 1 hour maybe 1-1/2 hours in her bedroom and usually has a TV in the background when she sleeps. Her bedroom is described as cool at about 47F with a ceiling fan running. Due to the TV it is neither quiet nor dark. She sleeps usually on 2 pillows one a body pillow one is for head support, she sleeps on her side and avoiding the shoulder, she ends up in supine position. She does not have trouble initiating sleep when she takes Ativan, and she has done so regularly. Most nights she is asleep by midnight but she is already in bed between 10 and 11 PM. Usually she can sleep through them until she is woken by one of her choking attacks. She will then sit up and tries to catch her breath. She usually can go back to sleep after that. She rises in the morning at about 6:30 after hitting the snooze button up to 8 times. [name] does not nap in daytime.   Sleep medical history:  Tourette syndrome, facial and vocal tics. Trouble to exhale, horrible snoring. Night terrors. The patient endorsed the Epworth Sleepiness Scale at 1 points.   The patient's weight 167 pounds with a height of 66 (inches), resulting in a BMI of 26.9 kg/m2. The patient's neck circumference measured 15.8 inches.  CURRENT MEDICATIONS: Zovirax, Lioresal, Catapres, Ativan, Protonix, Crestor, Ventolin   PROCEDURE:  This is a multichannel digital polysomnogram utilizing the Somnostar 11.2 system.  Electrodes and sensors were applied and monitored per AASM Specifications.   EEG, EOG, Chin and Limb EMG, were sampled at 200 Hz.  ECG, Snore and Nasal Pressure, Thermal Airflow, Respiratory Effort, CPAP Flow and Pressure, Oximetry was sampled at 50 Hz. Digital video and audio were recorded.      BASELINE STUDY: Lights Out was at 22:44 and Lights On at 04:59.  Total recording time (TRT) was 376 minutes, with a total sleep time (TST) of 246.5 minutes.  The patient's sleep latency was 54.5 minutes.  REM latency was 291 minutes.  The sleep efficiency was 65.6 %.     SLEEP ARCHITECTURE: WASO (Wake after sleep onset) was 74.5 minutes.  There were 24 minutes in Stage N1, 87.5 minutes Stage N2, 116 minutes Stage N3 and 19 minutes in Stage REM.  The percentage of Stage N1 was 9.7%, Stage N2 was  35.5%, Stage N3 was 47.1% and Stage R (REM sleep) was 7.7%.   RESPIRATORY ANALYSIS:  There were a total of 93 respiratory events:  18 obstructive apneas, 1 central apnea and 5 mixed apneas with a total of 24 apneas and 69 hypopneas.    The total APNEA/HYPOPNEA INDEX (AHI) was 22.6 /hour.  21 events occurred in REM sleep and 134 events in NREM.  The REM AHI was 66.3 /hour, versus a non-REM AHI of 19.  The patient spent 130 minutes of total sleep time in the supine position and 117 minutes in non-supine.  The supine AHI was 39.3/h versus a non-supine AHI of 4.1/h.  OXYGEN SATURATION & C02:  The Wake baseline 02 saturation was 98%,  with the lowest being 81%. Time spent below 89% saturation equaled 19 minutes.  The arousals were noted as: 29 were spontaneous, 0 were associated with PLMs, and 27 were associated with respiratory events. The patient had a total of 0 Periodic Limb Movements.   Audio and video analysis did not show any abnormal or unusual movements, behaviors, phonations or vocalizations.  The patient took one bathroom break. Snoring was noted- loudest in supine in the second half of this study. EKG was in keeping with normal sinus rhythm (NSR).  IMPRESSION: see attached hypnogram 1. Complex and mostly Obstructive Sleep Apnea (OSA),moderate degree at AHI 22.6/h. Strongly accentuated by REM sleep to AHI 66.3/h and supine sleep position 39.3/h  2. Loud Snoring, accentuated in supine and REM sleep. 3. Isolated Mixed and Central Sleep Apnea events.   4. Normal EKG/ sinus rhythm. 5. Sleep EEG with a high proportion of N3 sleep and very little REM sleep- likely medication induced.  6. No video or audio capture of parasomnia activity, but the arousals out of NREM stage 3 in the first 2 hours of sleep are spontaneous and associated with audible gasping following loud snoring.   RECOMMENDATIONS:  1. Advise full-night, attended, CPAP titration study to optimize therapy of complex apnea and snoring.  The patient also needs to avoid the supine sleep position.  2. Sleep hygiene: remove electronic lights, screens and sounds from your bedroom. Advance your bedtime to before midnight.   I certify that I have reviewed the entire raw data recording prior to the issuance of this report in accordance with the Standards of Accreditation of the American Academy of Sleep Medicine (AASM)  Larey Seat, MD   08-10-2018 Diplomat, American Board of Psychiatry and Neurology  Diplomat, American Board of Sleep Medicine Medical Director of [facility] at [facility]

## 2018-08-12 ENCOUNTER — Telehealth: Payer: Self-pay | Admitting: Neurology

## 2018-08-12 NOTE — Telephone Encounter (Signed)
Pt returned call. I advised pt that Dr. Brett Fairy reviewed their sleep study results and found that has complex sleep apnea and recommends that pt be treated with a cpap. Dr. Brett Fairy recommends that pt return for a repeat sleep study in order to properly titrate the cpap and ensure a good mask fit. Pt is agreeable to returning for a titration study. I advised pt that our sleep lab will file with pt's insurance and call pt to schedule the sleep study when we hear back from the pt's insurance regarding coverage of this sleep study. Pt verbalized understanding of results. Pt had no questions at this time but was encouraged to call back if questions arise.  Advised as of now our sleep lab has not opened back up to cpap titration studies. Informed her the order is placed and they will run through insurance. Advised the patient that soon as we are back up and running we will be in touch to get her set up to come in. Pt verbalized understanding.

## 2018-08-12 NOTE — Telephone Encounter (Signed)
-----   Message from Larey Seat, MD sent at 08/11/2018 10:10 AM EDT ----- IMPRESSION: see attached hypnogram 1. Complex and mostly Obstructive Sleep Apnea (OSA),moderate  degree at AHI 22.6/h. Strongly accentuated by REM sleep to AHI  66.3/h and supine sleep position 39.3/h  2. Loud Snoring, accentuated in supine and REM sleep. 3. Isolated Mixed and Central Sleep Apnea events.  4. Normal EKG/ sinus rhythm. 5. Sleep EEG with a high proportion of N3 sleep and very little  REM sleep- likely medication induced.  6. No video or audio capture of parasomnia activity, but the  arousals out of NREM stage 3 in the first 2 hours of sleep are  spontaneous and associated with audible gasping following loud  snoring.   RECOMMENDATIONS:  1. Advise full-night, attended, CPAP titration study to optimize  therapy of complex apnea and snoring. The patient also needs to  avoid the supine sleep position.  2. Sleep hygiene: remove electronic lights, screens and sounds  from your bedroom. Advance your bedtime to before midnight.   I certify that I have reviewed the entire raw data recording  prior to the issuance of this report in accordance with the  Standards of Accreditation of the American Academy of Sleep  Medicine (AASM)  Larey Seat, MD  08-10-2018 Diplomat, American Board of Psychiatry and Neurology  Diplomat, American Board of Sleep Medicine Medical Director of [facility] at [facility]

## 2018-08-12 NOTE — Telephone Encounter (Signed)
Called patient to discuss sleep study results. No answer at this time. LVM for the patient to call back.   

## 2018-08-16 ENCOUNTER — Telehealth: Payer: Self-pay | Admitting: Neurology

## 2018-08-16 ENCOUNTER — Encounter: Payer: Self-pay | Admitting: Neurology

## 2018-08-16 DIAGNOSIS — F5104 Psychophysiologic insomnia: Secondary | ICD-10-CM

## 2018-08-16 DIAGNOSIS — G4730 Sleep apnea, unspecified: Secondary | ICD-10-CM

## 2018-08-16 DIAGNOSIS — F519 Sleep disorder not due to a substance or known physiological condition, unspecified: Secondary | ICD-10-CM

## 2018-08-16 DIAGNOSIS — R0683 Snoring: Secondary | ICD-10-CM

## 2018-08-16 DIAGNOSIS — F5109 Other insomnia not due to a substance or known physiological condition: Secondary | ICD-10-CM

## 2018-08-16 DIAGNOSIS — F514 Sleep terrors [night terrors]: Secondary | ICD-10-CM

## 2018-08-16 NOTE — Telephone Encounter (Signed)
Called the patient back and informed her Dr Brett Fairy was ok with the patient starting auto CPAP. I reviewed PAP compliance expectations with the pt. Pt is agreeable to starting a CPAP. I advised pt that an order will be sent to a DME, [facility], and [facility] will call the pt within about one week after they file with the pt's insurance. [facility] will show the pt how to use the machine, fit for masks, and troubleshoot the CPAP if needed. A follow up appt was made for insurance purposes with Ward Givens, NP on July 23,2020 at 1:00 pm. Pt verbalized understanding to arrive 15 minutes early and bring their CPAP. A letter with all of this information in it will be mailed to the pt as a reminder. I verified with the pt that the address we have on file is correct. Pt verbalized understanding of results. Pt had no questions at this time but was encouraged to call back if questions arise. I have sent the order to [facility] and have received confirmation that they have received the order.

## 2018-08-16 NOTE — Telephone Encounter (Signed)
Pt called inquiring about her cpap titration study. I informed the pt that we are unable at this time to do titration studies. Pt asked is there anything else we could do because she is anxious to get started on the machine. I informed the pt I would pass this information on to you. Are we able to get the pt set up on auto cpap?

## 2018-08-16 NOTE — Telephone Encounter (Signed)
Lets do auto CPAP 5-13 cm water, 3 cm EPR.

## 2018-08-16 NOTE — Addendum Note (Signed)
Addended by: Larey Seat on: 08/16/2018 01:07 PM   Modules accepted: Orders

## 2018-08-30 ENCOUNTER — Telehealth: Payer: Self-pay

## 2018-08-30 NOTE — Telephone Encounter (Signed)
Patient has new insurance with [employer] and they denied her CPAP titration. She will need Auto cpap ordered.

## 2018-08-30 NOTE — Telephone Encounter (Signed)
Auto CPAP was ordered for the patient and sent to [facility]

## 2018-10-01 ENCOUNTER — Other Ambulatory Visit (HOSPITAL_COMMUNITY)
Admission: RE | Admit: 2018-10-01 | Discharge: 2018-10-01 | Disposition: A | Discharge status: Home / Self Care | Payer: BC Managed Care – PPO | Source: Ambulatory Visit | Attending: Neurology | Admitting: Neurology

## 2018-10-01 DIAGNOSIS — Z1159 Encounter for screening for other viral diseases: Secondary | ICD-10-CM | POA: Insufficient documentation

## 2018-10-01 LAB — SARS CORONAVIRUS 2 (TAT 6-24 HRS): SARS Coronavirus 2: NEGATIVE

## 2018-10-05 ENCOUNTER — Ambulatory Visit (INDEPENDENT_AMBULATORY_CARE_PROVIDER_SITE_OTHER): Payer: BLUE CROSS/BLUE SHIELD | Admitting: Neurology

## 2018-10-05 DIAGNOSIS — F519 Sleep disorder not due to a substance or known physiological condition, unspecified: Secondary | ICD-10-CM

## 2018-10-05 DIAGNOSIS — F5109 Other insomnia not due to a substance or known physiological condition: Secondary | ICD-10-CM

## 2018-10-05 DIAGNOSIS — F952 Tourette's disorder: Secondary | ICD-10-CM

## 2018-10-05 DIAGNOSIS — F5104 Psychophysiologic insomnia: Secondary | ICD-10-CM

## 2018-10-05 DIAGNOSIS — G4731 Primary central sleep apnea: Secondary | ICD-10-CM

## 2018-10-05 DIAGNOSIS — R0683 Snoring: Secondary | ICD-10-CM

## 2018-10-07 ENCOUNTER — Ambulatory Visit: Payer: Self-pay | Admitting: Adult Health

## 2018-10-11 NOTE — Procedures (Signed)
PATIENT'S NAME:  Danielle Krueger, Danielle Krueger DOB:      [birth year]      MR#:    [id number]     DATE OF RECORDING: 10/05/2018  AL REFERRING M.D.:  Anastasia Pall, MD Study Performed:   Titration to positive airway pressure HISTORY:  Mrs. Alessandrini is a 53 year old female patient whom I first encountered in a video visit from 06-22-2018, with complaints of trouble to exhale, Tourette syndrome and night terror history and HTN. Loud snoring per husband. She returned for a full night CPAP titration following her PSG Study performed on 08/09/18 , which resulted in an AHI (general) of 22.6/h, with a REM AHI of 66.3/h, supine AHI of 39.3/h with some central events noted.   Lowest SpO2 seen was 81% and total time in desaturation was 19 minutes.    The patient endorsed the Epworth Sleepiness Scale at 1 points.   The patient's weight 170 pounds with a height of 66 (inches), resulting in a BMI of 26.9 kg/m2. The patient's neck circumference measured 15.8 inches.  CURRENT MEDICATIONS: Zovirax, Principen, Lioresal, Catapres, Ativan, Protonix, Crestor, Ventolin   PROCEDURE:  This is a multichannel digital polysomnogram utilizing the SomnoStar 11.2 system.  Electrodes and sensors were applied and monitored per AASM Specifications.   EEG, EOG, Chin and Limb EMG, were sampled at 200 Hz.  ECG, Snore and Nasal Pressure, Thermal Airflow, Respiratory Effort, CPAP Flow and Pressure, Oximetry was sampled at 50 Hz. Digital video and audio were recorded.      CPAP was initiated at 5 cmH20 with heated humidity per AASM split night standards and CPAP pressure was advanced to 15 cm water, while there was still an AHI of 3.5/h. The technician changed to BiPAP modality- beginning with a setting of 16/12 cm water pressure and the patient could not initiate sleep- ST was added. ( AHI was 7.1/h) and arriving finally at 18/13 with ST of 12 /min. and AHI was 2.4/h.   The best AHI was noted at 10 cm water pressure and the best sleep efficiency was  seen at 10.11, and 12 cm water.  Lights Out was at 22:38 and Lights On at 05:29. Total recording time (TRT) was 379.5 minutes, with a total sleep time (TST) of 274 minutes. The patient's sleep latency was 0 minutes. REM latency was 228 minutes.  The sleep efficiency was 72.2 %.    SLEEP ARCHITECTURE: WASO (Wake after sleep onset) was 105.5 minutes.  There were 15 minutes in Stage N1, 84.5 minutes Stage N2, 162.5 minutes Stage N3 and 23 minutes in Stage REM.  The percentage of Stage N1 was 5.3%, Stage N2 was 29.6%, Stage N3 was 57.0% and Stage R (REM sleep) was 8.1%. The sleep architecture was notable for on period of prolonged REM sleep.  RESPIRATORY ANALYSIS:  There was a total of 29 respiratory events: 0 obstructive apneas, 2 central apneas and 1 mixed apnea with a total of 3 apneas and an apnea index (AI) of .7 /hour. There were 26 hypopneas with a hypopnea index of 5.7/hour. The patient also had 0 respiratory event related arousals (RERAs).     The total APNEA/HYPOPNEA INDEX (AHI) was 6.4 /h. 0 events occurred in REM sleep and 29 events in NREM. The REM AHI was 0/hour versus a non-REM AHI of 6.9 /hour. The patient spent 209.5 minutes of total sleep time in the supine position and 76 minutes in non-supine. The supine AHI was 7.9/h, versus a non-supine AHI of 2.4.  OXYGEN SATURATION &  C02:  The baseline 02 saturation was 0%, with the lowest being 84%. Time spent below 89% saturation equaled 8 minutes. The arousals were noted as: 22 were spontaneous, 0 were associated with PLMs, and 15 were associated with respiratory events. The patient had a total of 0 Periodic Limb Movements.  Audio and video analysis did not show any abnormal or unusual movements, behaviors, phonations or vocalizations.  The patient took one bathroom break. Some Snoring was noted. EKG was in keeping with regular sinus rhythm (NSR), bradycardia noted.   Post-study, the patient indicated that sleep was now better than usual.  The  patient was fitted with a ResMed F30i small FFM mask.  DIAGNOSIS Sleep apnea was corrected at CPAP levels of 10 cm H20, but the patient could tolerate BiPAP better, and indicated comfort at BiPAP ST 17/ 11cm and 18/12 cm water with ST 12/min, reaching REM sleep.  1. Primary Snoring corrected. 2. Obstructive Sleep Apnea corrected   3. Regular slow heart rate in EKG  PLANS/RECOMMENDATIONS: Let's meet after 60-90 days on BIPAP ST and re-evaluate fatigue, sleepiness and overall sleep quality.  1. PAP therapy compliance is defined as 4 hours or more of nightly use.    DISCUSSION: BiPAP was better tolerated and allowed REM rebound.   A follow up appointment will be scheduled in the Sleep Clinic at [facility].   Please call [phone] with any questions.      I certify that I have reviewed the entire raw data recording prior to the issuance of this report in accordance with the Standards of Accreditation of the American Academy of Sleep Medicine (AASM)   Larey Seat, M.D.  10-10-2018 Diplomat, ABPN- American Board in Neurology  Diplomat, American Board of [place] Director, [facility] at [employer] [employer] of Neurology and Sleep Medicine (Neurology and Sleep Medicine)

## 2018-10-11 NOTE — Addendum Note (Signed)
Addended by: Larey Seat on: 10/11/2018 12:28 PM   Modules accepted: Orders

## 2018-10-13 ENCOUNTER — Ambulatory Visit: Payer: BC Managed Care – PPO | Admitting: Neurology

## 2018-10-13 ENCOUNTER — Other Ambulatory Visit: Payer: Self-pay

## 2018-10-13 ENCOUNTER — Encounter: Payer: Self-pay | Admitting: Neurology

## 2018-10-13 VITALS — BP 117/84 | HR 76 | Temp 98.0°F | Ht 66.0 in | Wt 166.0 lb

## 2018-10-13 DIAGNOSIS — F519 Sleep disorder not due to a substance or known physiological condition, unspecified: Secondary | ICD-10-CM

## 2018-10-13 DIAGNOSIS — R079 Chest pain, unspecified: Secondary | ICD-10-CM

## 2018-10-13 DIAGNOSIS — G4731 Primary central sleep apnea: Secondary | ICD-10-CM

## 2018-10-13 DIAGNOSIS — F514 Sleep terrors [night terrors]: Secondary | ICD-10-CM | POA: Diagnosis not present

## 2018-10-13 DIAGNOSIS — G4739 Other sleep apnea: Secondary | ICD-10-CM

## 2018-10-13 DIAGNOSIS — F5109 Other insomnia not due to a substance or known physiological condition: Secondary | ICD-10-CM | POA: Diagnosis not present

## 2018-10-13 DIAGNOSIS — F952 Tourette's disorder: Secondary | ICD-10-CM | POA: Insufficient documentation

## 2018-10-13 MED ORDER — CLONIDINE 0.3 MG/24HR TD PTWK: 0.3000 mg | MEDICATED_PATCH | TRANSDERMAL | 12 refills | Status: DC

## 2018-10-13 NOTE — Progress Notes (Signed)
SLEEP MEDICINE CLINIC   Provider:  Larey Seat, MD  Primary Care Physician:  Danielle Noon, MD      Interval history from 10-13-2018. CD  I have the pleasure to see Danielle Krueger. Verry today a 53 year old Caucasian right-handed female with a history of night terrors parasomnia and complex sleep apnea.  This is a face-to-face visit dated 13 October 2018.  First I would like to summarize the patient's sleep study the patient has a history of loud snoring, Tourette's syndrome and hypertension she had a PSG performed on 09 Aug 2018 which resulted in an AHI of 22Krueger6/h during REM sleep exacerbated to 66Krueger3/h there was also supine sleep position accentuation to an AHI of 39Krueger3.  There were some central events noted which makes we will did describe in detail later.  Her total time this low oxygen desaturation was 19 minutes the nadir SPO2 was 81% oxygen saturation.   The patient returned on 21 July for an attended CPAP titration which was initiated at 5 cm water and step-by-step increased to 15 there was still an AHI of 3Krueger5/h noted the technician changed to BiPAP modality beginning at 16/12 cmH2O but the patient could not initiate sleep on BiPAP at first and ST a reminder breath setting was initiated and she finally arrived at 18/13 cmH2O BiPAP with ST of 12/min and the AHI was now reduced to 2Krueger4.  At the time of her titration the patient had actually already tried CPAP at home with an auto titration machine she used the machine 97% of the time for the last days of last night.   Average user time is 7 hours and 47 minutes the O2 sat allowed a pressure window from 5 cm minimum pressure to 13 cm maximum pressure, with 3 cm water pressure of expiratory pressure relief.  The residual AHI is high at 9Krueger1 but the patient spent 95th percentile pressure at 13 the maximum setting.  There were 3 central apneas per 4Krueger7 obstructive apneas noted and she also gained 7 minutes of Cheyne-Stokes respirations.  She felt  great ! awake and energetic.   Given the results of her titration I would think that she tolerate BiPAP better and will have much less central apnea.  For this reason we will change her CPAP to a BiPAP with a respiratory capture of 12/min.   Virtual Visit via Video Note:  06-22-2018  HPI:  Danielle Krueger is a 55 yKruegeroKruegercaucasian, married , right handed female , and she has been seen here  in a referral from Dr. Melford Krueger for the evaluation of sleep apnea on 11-27-2016. She had described her husbands observation of "horrible snoring" , difficult time waking up in the morning , but fragmented sleep with up to 3 times waking up sitting up and fighting for air. She has trouble to exhale not inhale ! She has used Ativan as a sleep aid for over 8 years. She now presents over 18 month after her initial visit as she has still not undergone an attended sleep study. She had undergone a HST which was not giving answers to her reported problems with organic parasomnia activity. Below are the notes and information I gathered at our last visit in person.     Sleep habits are as follows:Danielle Krueger will watch TV for 1 hour maybe 1-1/2 hours in her bedroom and usually has a TV in the background when she sleeps. Her bedroom is described as cool at about 1F with a ceiling  fan running. Due to the TV it is neither quiet nor dark. Mr. and Danielle Krueger shares a bedroom and she states that her husband, Danielle Krueger, is also wanting the TV in the background. The couple uses a sleep number bed, but it does not have an adjustable height. She sleeps usually on 2 pillows one a body pillow one is for head support, she sleeps on her side and avoiding the shoulder, she ends up in supine position.  She does not have trouble initiating sleep when she takes Ativan, and she has done so regularly. Most nights she is asleep by midnight but she is already in bed between 10 and 11 PM. There are rare nights where she will be up in spite of  medication until the early morning hours. Usually she can sleep through them until she is woken by one of her choking attacks. She will then sit up and tries to catch her breath. She usually can go back to sleep after that. She rises in the morning at about 6:30 after hitting the snooze button up to 8 times. Danielle Krueger does not nap in daytime.  Sleep medical history : Tourette syndrome, facial tics. Trouble to exhale, horrible snoring. Night terrors.    Family sleep history: Mother has sleep apnea, is in the process form changing from CPAP to BiPAP. Father deceased , Danielle Krueger was a patient here,  had "horrific" central sleep apnea. Used CPAP, but had CHF. He died 2011-06-21  Brother died of GBMKrueger04/08/2009 at [facility] had alcoholism, mother has abuse problems, too.   Patient has undergone hysterectomy in 06-21-14, and septoplasty in  1982/06/21 and again in1988. No tonsillectomy, sleep walking in childhood, night terrors. Still has some night terrors now at  Age 56 !  Tourette syndrome with clearing her throat- father had it, facial ticsKruegerDx by Dr. Earley Favor in 2nd grade.   Social history: married, adult children - 6, some of them step children. Danielle Kitchen, her youngest  biological son, Danielle Kitchen, has Asperger's and is bipolar. He is now home from college during the [place] [employer] crisis. 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  No window. No tobacco use now , quit in Jun 21, 2010.  Alcohol- seldomly ( 1 in 6 month ) ,  Caffeine : 1 mug in AM, one for the road, 2 diet cokes a week, less than in our last visit.   Review of Systems: Out of a complete 14 system review, the patient complains of only the following symptoms, and all other reviewed systems are negative. Depression, anxiety, chronic benzodiazepine.   How likely are you to doze in the following situations: 0 = not likely, 1 = slight chance, 2 = moderate chance, 3 = high chance  Sitting and Reading? Watching Television?  Sitting inactive in a public place (theater or meeting)? Lying down in the afternoon when circumstances permit? Sitting and talking to someone? Sitting quietly after lunch without alcohol? In a car, while stopped for a few minutes in traffic? As a passenger in a car for an hour without a break?  Total =1/ 24 on 4-7-20200 ,  Fatigue severity score 32/ 64,  depression score was not repeated.   High anxiety , Tourettes, muscle aches, and parasomnia- night terror.  Easily startled.    Social History   Socioeconomic History  . Marital status: Married    Spouse name: Not on file  . Number of children: Not on file  . Years of education: Not  on file  . Highest education level: Not on file  Occupational History  . Not on file  Social Needs  . Financial resource strain: Not on file  . Food insecurity    Worry: Not on file    Inability: Not on file  . Transportation needs    Medical: Not on file    Non-medical: Not on file  Tobacco Use  . Smoking status: Former Smoker    Packs/day: 1Krueger00    Years: 15Krueger00    Pack years: 15Krueger00    Quit date: 2011    Years since quitting: 9Krueger5  . Smokeless tobacco: Never Used  Substance and Sexual Activity  . Alcohol use: Yes    Comment: Very rare  . Drug use: No  . Sexual activity: Not on file  Lifestyle  . Physical activity    Days per week: Not on file    Minutes per session: Not on file  . Stress: Not on file  Relationships  . Social Herbalist on phone: Not on file    Gets together: Not on file    Attends religious service: Not on file    Active member of club or organization: Not on file    Attends meetings of clubs or organizations: Not on file    Relationship status: Not on file  . Intimate partner violence    Fear of current or ex partner: Not on file    Emotionally abused: Not on file    Physically abused: Not on file    Forced sexual activity: Not on file  Other Topics Concern  . Not on file  Social History  Narrative   6 children combined remarriage. Works for [employer]. Wants to work out at [employer].     Family History  Problem Relation Age of Onset  . Breast cancer Mother   . Heart attack Mother   . Sleep apnea Mother   . Sleep apnea Father   . Heart disease Father   . Brain cancer Brother   . Coronary artery disease Neg Hx        premature    Past Medical History:  Diagnosis Date  . Acne   . Broken collarbone   . Chest pain    Eval ER 5/24 R/O normal ECG negative markers, ? anxiety  . Complication of anesthesia    age 82ys, "could not breathe after anes started"   . Depressed   . Dislocated shoulder    right     Past Surgical History:  Procedure Laterality Date  . ABDOMINAL HYSTERECTOMY  2016  . BANKHART REPAIR SHOULDER    . KNEE ARTHROPLASTY    . laser of vulva    . RHINOPLASTY     age 37yrs  . uterine ablation      Current Outpatient Medications  Medication Sig Dispense Refill  . cloNIDine (CATAPRES) 0Krueger1 MG tablet take 3 tablets by mouth three times a day if needed  0  . desvenlafaxine (PRISTIQ) 100 MG 24 hr tablet Take 100 mg by mouth daily.     . Diethylpropion HCl CR 75 MG TB24 Take 1 tablet by mouth every morning.  0  . LORazepam (ATIVAN) 0Krueger5 MG tablet Take 0Krueger5 mg by mouth at bedtime as needed. sleep    . progesterone (PROMETRIUM) 100 MG capsule     . rosuvastatin (CRESTOR) 10 MG tablet     . Sulfacetamide Sodium-Sulfur 10-5 % CREA APPLY ON THE SKIN/FACE NIGHTLY.    Danielle Kitchen  acyclovir (ZOVIRAX) 400 MG tablet Take 400 mg by mouth 5 (five) times daily. prn    . baclofen (LIORESAL) 20 MG tablet 20 mg 3 (three) times daily.   0  . VENTOLIN HFA 108 (90 Base) MCG/ACT inhaler INL 2 PFS PO Q 6 H PRF WHZ OR SOB     No current facility-administered medications for this visit.     Allergies as of 10/13/2018 - Review Complete 10/13/2018  Allergen Reaction Noted  . Metronidazole Rash     Vitals: BP 117/84   Pulse 76   Temp 98 F (36Krueger7 C)   Ht 5\' 6"  (1Krueger676 m)    Wt 166 lb (75Krueger3 kg)   BMI 26Krueger79 kg/m  Last Weight:   Wt Readings from Last 1 Encounters:  10/13/18 166 lb (75Krueger3 kg)   NZV:JKQA mass index is 26Krueger79 kg/m.  Last Height:    Ht Readings from Last 1 Encounters:  10/13/18 5\' 6"  (0Krueger601 m)   Physical exam: General: The patient is awake, alert and appears not in acute distress. The patient is well groomed. Head: Normocephalic, atraumatic. Neck is supple. Mallampati 1   ,  neck circumference:15Krueger75 ". Nasal airflow appears restricted , she reports TMJ click . Retrognathia is not seen.  Respiratory: no evidence of SOB. Skin:  Reports no evidence of edema, or rash Trunk: BMI is 27. The patient's posture is erect.  OBSERVATION:  The patient is awake and alert, oriented to place and time.    Attention span & concentration ability appears normal.  Speech is fluent, without dysarthria, dysphonia , she speaks clearly but slowly- some word finding may be delayed / aphasia. Mood and affect are appropriate. Cranial nerves: Pupils are equal . Facial motor strength is symmetric and tongue and uvula move midline. Shoulder shrug was symmetrical.  I did not witness a facial tic during this video guided conversation  Motor exam:   normal muscle bulk and symmetric ROM in all extremities.  Coordination: Movements in the fingers/hands was normal.  Gait and station: Patient walks without assistive device.  Assessment:  After review of pre-existing records as far as provided., my assessment is :  Primarily problems to exhale at night, loud snoring, will check for apnea and especially central apnea with f gasping for air. There is Upper Airway Resistance with nasal congestion- today severe.   1)  CSA_ Had central apneas on auto CPAP, and a high AHI of 9Krueger1/h. attended sleep study revealed  central and complex sleep apnea, and she tolerated BiPAP 18/ 12 cm water pressure ST 12  2) night terrors-  Going on for a decade now, initially reduced under ativan but  still present.. she is allowed to continue Ativan and should continue to take Pristiiq.   2) insomnia with delayed sleep onset, prolonged latency, she has worked on her sleep hygiene, TV, caffeine intake. There are sleep choking episodes, the percievd blocking of the airway when exhaling, a scary sensation that wakes her frequently.   3) Tourette syndrome, controlled somewhat on Clonidine tid and Baclofen. Prescribed by Dr Danielle Krueger. I suggest we change to a patch of clonidin as this gives more reliable round the clock blood levels. She reduced baclofen at bedtime to not foster central apnea.  She still takes nicotine lozenges.     I discussed the assessment and treatment plan with the patient. The patient was provided an opportunity to ask questions and all were answered. The patient agreed with the plan and demonstrated an understanding of  the instructions.   The patient was advised to call back or seek an in-person evaluation if the symptoms worsen or if the condition fails to improve as anticipated.  I provided 25 minutes of non-face-to-face time during this encounter.    Larey Seat, MD  11-18-8331 8:32 AM  Certified in Neurology by ABPN Certified in Sleep Medicine by [facility] [address] [place], [place] [address]    Lieutenant Diego, MD

## 2018-10-13 NOTE — Patient Instructions (Signed)
Clonidine skin patches  0.3 mg patch  What is this medicine? CLONIDINE (KLOE ni deen) is used to treat high blood pressure. This medicine may be used for other purposes; ask your health care provider or pharmacist if you have questions. COMMON BRAND NAME(S): Catapres-TTS What should I tell my health care provider before I take this medicine? They need to know if you have any of these conditions:  kidney disease  an unusual or allergic reaction to clonidine, other medicines, foods, dyes, or preservatives  pregnant or trying to get pregnant  breast-feeding How should I use this medicine? This medicine is for external use only. Follow the directions on the prescription label. Apply the patch to an area of the upper arm or part of the body that is clean, dry and hairless. Avoid injured, irritated, calloused, or scarred areas. Use a different site each time to prevent skin irritation. Do not cut or trim the patch. One patch should last for 7 days. Do not use your medicine more often than directed. Do not stop using except on the advice of your doctor or health care professional. You must gradually reduce the dose or you may get a dangerous increase in blood pressure. Talk to your pediatrician regarding the use of this medicine in children. Special care may be needed. Overdosage: If you think you have taken too much of this medicine contact a poison control center or emergency room at once. NOTE: This medicine is only for you. Do not share this medicine with others. What if I miss a dose? Replace each patch on the same day of each week, or if the patch falls off. If you do forget to change the patch for two or three days, check with your doctor or health care professional. What may interact with this medicine? Do not take this medicine with any of the following medications:  MAOIs like Carbex, Eldepryl, Marplan, Nardil, and Parnate This medicine may also interact with the following  medications:  barbiturate medicines for inducing sleep or treating seizures like phenobarbital  certain medicines for blood pressure, heart disease, irregular heart beat  certain medicines for depression, anxiety, or psychotic disturbances  prescription pain medicines This list may not describe all possible interactions. Give your health care provider a list of all the medicines, herbs, non-prescription drugs, or dietary supplements you use. Also tell them if you smoke, drink alcohol, or use illegal drugs. Some items may interact with your medicine. What should I watch for while using this medicine? Visit your doctor or health care professional for regular checks on your progress. Check your heart rate and blood pressure regularly while you are using this medicine. Ask your doctor or health care professional what your heart rate should be and when you should contact him or her. You can shower or bathe with the skin patch in position. If the patch gets loose, cover it with the extra adhesive overlay provided. You may get drowsy or dizzy. Do not drive, use machinery, or do anything that needs mental alertness until you know how this medicine affects you. To avoid dizzy or fainting spells, do not stand or sit up quickly, especially if you are an older person. Alcohol can make you more drowsy and dizzy. Avoid alcoholic drinks. Your mouth may get dry. Chewing sugarless gum or sucking hard candy, and drinking plenty of water will help. Do not treat yourself for coughs, colds, or pain while you are using this medicine without asking your doctor or health care  professional for advice. Some ingredients may increase your blood pressure. If you are going to have surgery tell your doctor or health care professional that you are using this medicine. If you are going to have a magnetic resonance imaging (MRI) procedure, tell your MRI technician if you have this patch on your body. It must be removed before a  MRI. What side effects may I notice from receiving this medicine? Side effects that you should report to your doctor or health care professional as soon as possible:  allergic reactions like skin rash, itching or hives, swelling of the face, lips, or tongue  anxiety, nervousness  chest pain  depression  fast, irregular heartbeat  swelling of feet or legs  unusually weak or tired Side effects that usually do not require medical attention (report to your doctor or health care professional if they continue or are bothersome):  change in sex drive or performance  constipation  headache  skin redness, irritation, or darkening under the patch area   This list may not describe all possible side effects. Call your doctor for medical advice about side effects. You may report side effects to FDA at 1-800-FDA-1088. Where should I keep my medicine? Keep out of the reach of children. Store at room temperature between 15 and 30 degrees C (59 to 86 degrees F). Throw away any unused medicine after the expiration date. NOTE: This sheet is a summary. It may not cover all possible information. If you have questions about this medicine, talk to your doctor, pharmacist, or health care provider.  2020 Elsevier/Gold Standard (2014-11-13 30:07:62)

## 2019-01-20 ENCOUNTER — Ambulatory Visit: Payer: BC Managed Care – PPO | Admitting: Neurology

## 2019-08-01 ENCOUNTER — Ambulatory Visit: Payer: Self-pay | Admitting: General Surgery

## 2019-08-01 NOTE — H&P (View-Only) (Signed)
The patient is a 54 year old female who presents with a complaint of anal problems.54 year old female who presents to the office for evaluation of an anal skin tag. She has noticed this for many years. She has a history of laser ablation for precancerous lesions and is concerned that this may be another lesion like that. She denies any rectal bleeding. She has not had a screening colonoscopy. Her bowel habits are regular, and she takes a fiber supplement daily. She denies any straining.   Problem List/Past Medical Leighton Ruff, MD; [id number] 4:20 PM) ANAL SKIN TAG (K64.4)  Past Surgical History Leighton Ruff, MD; [id number] 4:20 PM) Hysterectomy (not due to cancer) - Complete Knee Surgery Left. Oral Surgery Shoulder Surgery Right.  Diagnostic Studies History Leighton Ruff, MD; [id number] 4:20 PM) Colonoscopy never Mammogram 1-3 years ago Pap Smear 1-5 years ago  Allergies (Tanisha A. Owens Shark, [place]; 08/01/2019 4:07 PM) MetroNIDAZOLE *DERMATOLOGICALS* Allergies Reconciled  Medication History (Tanisha A. Owens Shark, [place]; 08/01/2019 4:09 PM) busPIRone HCl (10MG  Tablet, Oral) Active. Progesterone Micronized (100MG  Capsule, Oral) Active. LORazepam (0.5MG  Tablet, Oral) Active. Baclofen (10MG  Tablet, Oral) Active. Baclofen (20MG  Tablet, Oral) Active. Desvenlafaxine ER (100MG  Tablet ER 24HR, Oral) Active. cloNIDine HCl (0.3MG  Tablet, Oral) Active. Medications Reconciled  Social History Leighton Ruff, MD; [id number] 4:20 PM) Alcohol use Occasional alcohol use. Caffeine use Carbonated beverages, Coffee, Tea. No drug use Tobacco use Former smoker.  Family History Leighton Ruff, MD; [id number] 4:20 PM) Alcohol Abuse Brother, Son. Anesthetic complications Family Members In General. Arthritis Family Members In General, Father, Mother. Breast Cancer Mother. Cancer Brother. Cerebrovascular Accident Father. Colon Polyps Family Members In General. Depression  Brother, Family Members In [place], Father, Son. Heart Disease Father, Mother. Heart disease in female family member before age 21 Heart disease in female family member before age 84 Hypertension Father, Mother. Migraine Headache Mother, Son. Thyroid problems Father.  Pregnancy / Birth History Leighton Ruff, MD; [id number] 4:20 PM) Age at menarche 42 years. Age of menopause 7-50 Contraceptive History Intrauterine device, Oral contraceptives. Gravida 2 Length (months) of breastfeeding 3-6 Maternal age 19-30 Para 2  Other Problems Leighton Ruff, MD; [id number] 4:20 PM) Anxiety Disorder Asthma Back Pain Cervical Cancer Chest pain Depression Gastroesophageal Reflux Disease General anesthesia - complications Heart murmur Hemorrhoids Migraine Headache Oophorectomy Bilateral. SCREENING FOR COLORECTAL CANCER (Z12.11, Z12.12)     Review of Systems Leighton Ruff MD; [id number] 4:20 PM) General Not Present- Appetite Loss, Chills, Fatigue, Fever, Night Sweats, Weight Gain and Weight Loss. Skin Present- Dryness. Not Present- Change in Wart/Mole, Hives, Jaundice, New Lesions, Non-Healing Wounds, Rash and Ulcer. HEENT Present- Seasonal Allergies and Wears glasses/contact lenses. Not Present- Earache, Hearing Loss, Hoarseness, Nose Bleed, Oral Ulcers, Ringing in the Ears, Sinus Pain, Sore Throat, Visual Disturbances and Yellow Eyes. Respiratory Present- Snoring. Not Present- Bloody sputum, Chronic Cough, Difficulty Breathing and Wheezing. Cardiovascular Not Present- Chest Pain, Difficulty Breathing Lying Down, Leg Cramps, Palpitations, Rapid Heart Rate, Shortness of Breath and Swelling of Extremities. Gastrointestinal Present- Excessive gas and Hemorrhoids. Not Present- Abdominal Pain, Bloating, Bloody Stool, Change in Bowel Habits, Chronic diarrhea, Constipation, Difficulty Swallowing, Gets full quickly at meals, Indigestion, Nausea, Rectal Pain and  Vomiting. Female Genitourinary Present- Frequency. Not Present- Nocturia, Painful Urination, Pelvic Pain and Urgency. Musculoskeletal Present- Joint Stiffness. Not Present- Back Pain, Joint Pain, Muscle Pain, Muscle Weakness and Swelling of Extremities. Neurological Present- Numbness. Not Present- Decreased Memory, Fainting, Headaches, Seizures, Tingling, Tremor, Trouble walking and Weakness. Psychiatric Present- Anxiety. Not Present- Bipolar, Change in Sleep Pattern, Depression,  Fearful and Frequent crying. Endocrine Present- Hot flashes. Not Present- Cold Intolerance, Excessive Hunger, Hair Changes, Heat Intolerance and New Diabetes.  Vitals (Tanisha A. Brown RMA; 08/01/2019 4:09 PM) 08/01/2019 4:09 PM Weight: 179.6 lb Height: 66in Body Surface Area: 1.91 m Body Mass Index: 28.99 kg/m  Temp.: 97.43F  Pulse: 86 (Regular)  BP: 118/82(Sitting, Left Arm, Standard)        Physical Exam Leighton Ruff MD; [id number] 4:19 PM)  General Mental Status-Alert. General Appearance-Not in acute distress. Build & Nutrition-Well nourished. Posture-Normal posture. Gait-Normal.  Head and Neck Head-normocephalic, atraumatic with no lesions or palpable masses. Trachea-midline.  Chest and Lung Exam Chest and lung exam reveals -on auscultation, normal breath sounds, no adventitious sounds and normal vocal resonance.  Cardiovascular Cardiovascular examination reveals -normal heart sounds, regular rate and rhythm with no murmurs and no digital clubbing, cyanosis, edema, increased warmth or tenderness.  Abdomen Inspection Inspection of the abdomen reveals - No Hernias. Palpation/Percussion Palpation and Percussion of the abdomen reveal - Soft, Non Tender, No Rigidity (guarding), No hepatosplenomegaly and No Palpable abdominal masses.  Rectal Note: Anterior skin tag with abnormal appearing skin with a discrete lesion  Neurologic Neurologic evaluation reveals  -alert and oriented x 3 with no impairment of recent or remote memory, normal attention span and ability to concentrate, normal sensation and normal coordination.  Musculoskeletal Normal Exam - Bilateral-Upper Extremity Strength Normal and Lower Extremity Strength Normal.    Assessment & Plan Leighton Ruff MD; [id number] 4:18 PM)  ANAL SKIN TAG (K64.4) Impression: 54 year old female who presents to the office with abnormal-appearing anal skin tag. This was seen approximately 3 years ago and I recommended excision at that point. Patient has had several issues in her life. Over the past few years and has been unable to proceed with this. She is ready to have this done now. On exam today, she has approximately 1 cm lesion noted on anterior skin tag. I would like to excise this with 1 mm margins and I think this will best be done in the surgical center. Risk of procedure including bleeding, pain and recurrence.

## 2019-08-01 NOTE — H&P (Signed)
The patient is a 54 year old female who presents with a complaint of anal problems.54 year old female who presents to the office for evaluation of an anal skin tag. She has noticed this for many years. She has a history of laser ablation for precancerous lesions and is concerned that this may be another lesion like that. She denies any rectal bleeding. She has not had a screening colonoscopy. Her bowel habits are regular, and she takes a fiber supplement daily. She denies any straining.   Problem List/Past Medical Leighton Ruff, MD; [id number] 4:20 PM) ANAL SKIN TAG (K64.4)  Past Surgical History Leighton Ruff, MD; [id number] 4:20 PM) Hysterectomy (not due to cancer) - Complete Knee Surgery Left. Oral Surgery Shoulder Surgery Right.  Diagnostic Studies History Leighton Ruff, MD; [id number] 4:20 PM) Colonoscopy never Mammogram 1-3 years ago Pap Smear 1-5 years ago  Allergies (Tanisha A. Owens Shark, [place]; 08/01/2019 4:07 PM) MetroNIDAZOLE *DERMATOLOGICALS* Allergies Reconciled  Medication History (Tanisha A. Owens Shark, [place]; 08/01/2019 4:09 PM) busPIRone HCl (10MG  Tablet, Oral) Active. Progesterone Micronized (100MG  Capsule, Oral) Active. LORazepam (0.5MG  Tablet, Oral) Active. Baclofen (10MG  Tablet, Oral) Active. Baclofen (20MG  Tablet, Oral) Active. Desvenlafaxine ER (100MG  Tablet ER 24HR, Oral) Active. cloNIDine HCl (0.3MG  Tablet, Oral) Active. Medications Reconciled  Social History Leighton Ruff, MD; [id number] 4:20 PM) Alcohol use Occasional alcohol use. Caffeine use Carbonated beverages, Coffee, Tea. No drug use Tobacco use Former smoker.  Family History Leighton Ruff, MD; [id number] 4:20 PM) Alcohol Abuse Brother, Son. Anesthetic complications Family Members In General. Arthritis Family Members In General, Father, Mother. Breast Cancer Mother. Cancer Brother. Cerebrovascular Accident Father. Colon Polyps Family Members In General. Depression  Brother, Family Members In [place], Father, Son. Heart Disease Father, Mother. Heart disease in female family member before age 72 Heart disease in female family member before age 47 Hypertension Father, Mother. Migraine Headache Mother, Son. Thyroid problems Father.  Pregnancy / Birth History Leighton Ruff, MD; [id number] 4:20 PM) Age at menarche 63 years. Age of menopause 50-50 Contraceptive History Intrauterine device, Oral contraceptives. Gravida 2 Length (months) of breastfeeding 3-6 Maternal age 55-30 Para 2  Other Problems Leighton Ruff, MD; [id number] 4:20 PM) Anxiety Disorder Asthma Back Pain Cervical Cancer Chest pain Depression Gastroesophageal Reflux Disease General anesthesia - complications Heart murmur Hemorrhoids Migraine Headache Oophorectomy Bilateral. SCREENING FOR COLORECTAL CANCER (Z12.11, Z12.12)     Review of Systems Leighton Ruff MD; [id number] 4:20 PM) General Not Present- Appetite Loss, Chills, Fatigue, Fever, Night Sweats, Weight Gain and Weight Loss. Skin Present- Dryness. Not Present- Change in Wart/Mole, Hives, Jaundice, New Lesions, Non-Healing Wounds, Rash and Ulcer. HEENT Present- Seasonal Allergies and Wears glasses/contact lenses. Not Present- Earache, Hearing Loss, Hoarseness, Nose Bleed, Oral Ulcers, Ringing in the Ears, Sinus Pain, Sore Throat, Visual Disturbances and Yellow Eyes. Respiratory Present- Snoring. Not Present- Bloody sputum, Chronic Cough, Difficulty Breathing and Wheezing. Cardiovascular Not Present- Chest Pain, Difficulty Breathing Lying Down, Leg Cramps, Palpitations, Rapid Heart Rate, Shortness of Breath and Swelling of Extremities. Gastrointestinal Present- Excessive gas and Hemorrhoids. Not Present- Abdominal Pain, Bloating, Bloody Stool, Change in Bowel Habits, Chronic diarrhea, Constipation, Difficulty Swallowing, Gets full quickly at meals, Indigestion, Nausea, Rectal Pain and  Vomiting. Female Genitourinary Present- Frequency. Not Present- Nocturia, Painful Urination, Pelvic Pain and Urgency. Musculoskeletal Present- Joint Stiffness. Not Present- Back Pain, Joint Pain, Muscle Pain, Muscle Weakness and Swelling of Extremities. Neurological Present- Numbness. Not Present- Decreased Memory, Fainting, Headaches, Seizures, Tingling, Tremor, Trouble walking and Weakness. Psychiatric Present- Anxiety. Not Present- Bipolar, Change in Sleep Pattern, Depression,  Fearful and Frequent crying. Endocrine Present- Hot flashes. Not Present- Cold Intolerance, Excessive Hunger, Hair Changes, Heat Intolerance and New Diabetes.  Vitals (Tanisha A. Brown RMA; 08/01/2019 4:09 PM) 08/01/2019 4:09 PM Weight: 179.6 lb Height: 66in Body Surface Area: 1.91 m Body Mass Index: 28.99 kg/m  Temp.: 97.42F  Pulse: 86 (Regular)  BP: 118/82(Sitting, Left Arm, Standard)        Physical Exam Leighton Ruff MD; [id number] 4:19 PM)  General Mental Status-Alert. General Appearance-Not in acute distress. Build & Nutrition-Well nourished. Posture-Normal posture. Gait-Normal.  Head and Neck Head-normocephalic, atraumatic with no lesions or palpable masses. Trachea-midline.  Chest and Lung Exam Chest and lung exam reveals -on auscultation, normal breath sounds, no adventitious sounds and normal vocal resonance.  Cardiovascular Cardiovascular examination reveals -normal heart sounds, regular rate and rhythm with no murmurs and no digital clubbing, cyanosis, edema, increased warmth or tenderness.  Abdomen Inspection Inspection of the abdomen reveals - No Hernias. Palpation/Percussion Palpation and Percussion of the abdomen reveal - Soft, Non Tender, No Rigidity (guarding), No hepatosplenomegaly and No Palpable abdominal masses.  Rectal Note: Anterior skin tag with abnormal appearing skin with a discrete lesion  Neurologic Neurologic evaluation reveals  -alert and oriented x 3 with no impairment of recent or remote memory, normal attention span and ability to concentrate, normal sensation and normal coordination.  Musculoskeletal Normal Exam - Bilateral-Upper Extremity Strength Normal and Lower Extremity Strength Normal.    Assessment & Plan Leighton Ruff MD; [id number] 4:18 PM)  ANAL SKIN TAG (K64.4) Impression: 54 year old female who presents to the office with abnormal-appearing anal skin tag. This was seen approximately 3 years ago and I recommended excision at that point. Patient has had several issues in her life. Over the past few years and has been unable to proceed with this. She is ready to have this done now. On exam today, she has approximately 1 cm lesion noted on anterior skin tag. I would like to excise this with 1 mm margins and I think this will best be done in the surgical center. Risk of procedure including bleeding, pain and recurrence.

## 2019-08-19 ENCOUNTER — Encounter (HOSPITAL_BASED_OUTPATIENT_CLINIC_OR_DEPARTMENT_OTHER): Payer: Self-pay | Admitting: General Surgery

## 2019-08-19 ENCOUNTER — Other Ambulatory Visit: Payer: Self-pay

## 2019-08-19 NOTE — Progress Notes (Signed)
[employer] COVID vaccine completed 06/2019.

## 2019-08-19 NOTE — Progress Notes (Signed)
Spoke with: Elzie Rings NPO:  After Midnight, no gum, candy, or mints   Arrival time: 0815 AM Lab needs dos----    Istat 8, EKG          Lab results------N/A COVID test ------08/23/2019 Medications to take morning of surgery: Desvenlafaxine, Clonidine, Ampicilliin Pre op orders in epic Yes Diabetic medication -----N/A Patient Special Instructions -----Bring BIPAP machine Pre-Op special Istructions -----N/A Ride home: Ivy Lynn (aunt) [phone]  Patient verbalized understanding of instructions that were given at this phone interview. Patient denies shortness of breath, chest pain, fever, cough a this phone interview.

## 2019-08-23 ENCOUNTER — Other Ambulatory Visit (HOSPITAL_COMMUNITY)
Admission: RE | Admit: 2019-08-23 | Discharge: 2019-08-23 | Disposition: A | Discharge status: Home / Self Care | Payer: BC Managed Care – PPO | Source: Ambulatory Visit | Attending: General Surgery | Admitting: General Surgery

## 2019-08-23 DIAGNOSIS — Z20822 Contact with and (suspected) exposure to covid-19: Secondary | ICD-10-CM | POA: Insufficient documentation

## 2019-08-23 DIAGNOSIS — Z01812 Encounter for preprocedural laboratory examination: Secondary | ICD-10-CM | POA: Diagnosis not present

## 2019-08-23 LAB — SARS CORONAVIRUS 2 (TAT 6-24 HRS): SARS Coronavirus 2: NEGATIVE

## 2019-08-26 ENCOUNTER — Other Ambulatory Visit: Payer: Self-pay

## 2019-08-26 ENCOUNTER — Ambulatory Visit (HOSPITAL_BASED_OUTPATIENT_CLINIC_OR_DEPARTMENT_OTHER): Payer: BC Managed Care – PPO | Admitting: Anesthesiology

## 2019-08-26 ENCOUNTER — Ambulatory Visit (HOSPITAL_BASED_OUTPATIENT_CLINIC_OR_DEPARTMENT_OTHER)
Admission: RE | Admit: 2019-08-26 | Discharge: 2019-08-26 | Disposition: A | Discharge status: Home / Self Care | Payer: BC Managed Care – PPO | Attending: General Surgery | Admitting: General Surgery

## 2019-08-26 ENCOUNTER — Encounter (HOSPITAL_BASED_OUTPATIENT_CLINIC_OR_DEPARTMENT_OTHER)
Admission: RE | Disposition: A | Discharge status: Home / Self Care | Payer: Self-pay | Source: Home / Self Care | Attending: General Surgery

## 2019-08-26 ENCOUNTER — Encounter (HOSPITAL_BASED_OUTPATIENT_CLINIC_OR_DEPARTMENT_OTHER): Payer: Self-pay | Admitting: General Surgery

## 2019-08-26 DIAGNOSIS — Z79899 Other long term (current) drug therapy: Secondary | ICD-10-CM | POA: Insufficient documentation

## 2019-08-26 DIAGNOSIS — K644 Residual hemorrhoidal skin tags: Secondary | ICD-10-CM | POA: Diagnosis present

## 2019-08-26 DIAGNOSIS — F329 Major depressive disorder, single episode, unspecified: Secondary | ICD-10-CM | POA: Diagnosis not present

## 2019-08-26 DIAGNOSIS — Z87891 Personal history of nicotine dependence: Secondary | ICD-10-CM | POA: Insufficient documentation

## 2019-08-26 DIAGNOSIS — D045 Carcinoma in situ of skin of trunk: Secondary | ICD-10-CM | POA: Insufficient documentation

## 2019-08-26 DIAGNOSIS — F419 Anxiety disorder, unspecified: Secondary | ICD-10-CM | POA: Insufficient documentation

## 2019-08-26 HISTORY — DX: Rosacea, unspecified: L71.9

## 2019-08-26 HISTORY — DX: Hyperlipidemia, unspecified: E78.5

## 2019-08-26 HISTORY — DX: Other intervertebral disc degeneration, lumbar region: M51.36

## 2019-08-26 HISTORY — DX: Cardiac murmur, unspecified: R01.1

## 2019-08-26 HISTORY — PX: EXCISION OF SKIN TAG: SHX6270

## 2019-08-26 HISTORY — DX: Generalized anxiety disorder: F41.1

## 2019-08-26 HISTORY — DX: Obstructive sleep apnea (adult) (pediatric): G47.33

## 2019-08-26 HISTORY — DX: Tourette's disorder: F95.2

## 2019-08-26 HISTORY — DX: Pneumonia, unspecified organism: J18.9

## 2019-08-26 HISTORY — DX: Headache, unspecified: R51.9

## 2019-08-26 HISTORY — DX: Gastro-esophageal reflux disease without esophagitis: K21.9

## 2019-08-26 HISTORY — DX: Other specified postprocedural states: Z98.890

## 2019-08-26 HISTORY — DX: Unspecified asthma, uncomplicated: J45.909

## 2019-08-26 HISTORY — DX: Nausea with vomiting, unspecified: R11.2

## 2019-08-26 HISTORY — DX: Unspecified chronic bronchitis: J42

## 2019-08-26 HISTORY — DX: Other intervertebral disc degeneration, lumbar region without mention of lumbar back pain or lower extremity pain: M51.369

## 2019-08-26 LAB — POCT I-STAT, CHEM 8
BUN: 18 mg/dL (ref 6–20)
Calcium, Ion: 1.24 mmol/L (ref 1.15–1.40)
Chloride: 102 mmol/L (ref 98–111)
Creatinine, Ser: 0.9 mg/dL (ref 0.44–1.00)
Glucose, Bld: 100 mg/dL — ABNORMAL HIGH (ref 70–99)
HCT: 39 % (ref 36.0–46.0)
Hemoglobin: 13.3 g/dL (ref 12.0–15.0)
Potassium: 4.1 mmol/L (ref 3.5–5.1)
Sodium: 141 mmol/L (ref 135–145)
TCO2: 28 mmol/L (ref 22–32)

## 2019-08-26 SURGERY — EXCISION, SKIN TAG
Anesthesia: Monitor Anesthesia Care | Site: Rectum

## 2019-08-26 MED ORDER — LIDOCAINE 2% (20 MG/ML) 5 ML SYRINGE
INTRAMUSCULAR | Status: AC
  Filled 2019-08-26: qty 5

## 2019-08-26 MED ORDER — ONDANSETRON HCL 4 MG/2ML IJ SOLN
INTRAMUSCULAR | Status: AC
  Filled 2019-08-26: qty 2

## 2019-08-26 MED ORDER — ONDANSETRON HCL 4 MG/2ML IJ SOLN
INTRAMUSCULAR | Status: DC | PRN
  Administered 2019-08-26: 4 mg via INTRAVENOUS

## 2019-08-26 MED ORDER — PROPOFOL 10 MG/ML IV BOLUS
INTRAVENOUS | Status: DC | PRN
  Administered 2019-08-26: 20 mg via INTRAVENOUS

## 2019-08-26 MED ORDER — SODIUM CHLORIDE 0.9 % IV SOLN: 250.0000 mL | INTRAVENOUS | Status: DC | PRN

## 2019-08-26 MED ORDER — HYDROCODONE-ACETAMINOPHEN 5-325 MG PO TABS: 1.0000 | ORAL_TABLET | Freq: Three times a day (TID) | ORAL | 0 refills | Status: DC | PRN

## 2019-08-26 MED ORDER — MEPERIDINE HCL 25 MG/ML IJ SOLN: 6.2500 mg | INTRAMUSCULAR | Status: DC | PRN

## 2019-08-26 MED ORDER — OXYCODONE HCL 5 MG PO TABS: 5.0000 mg | ORAL_TABLET | ORAL | Status: DC | PRN

## 2019-08-26 MED ORDER — ACETAMINOPHEN 500 MG PO TABS
1000.0000 mg | ORAL_TABLET | ORAL | Status: AC
  Administered 2019-08-26: 1000 mg via ORAL

## 2019-08-26 MED ORDER — OXYCODONE HCL 5 MG PO TABS: 5.0000 mg | ORAL_TABLET | Freq: Once | ORAL | Status: DC | PRN

## 2019-08-26 MED ORDER — KETOROLAC TROMETHAMINE 30 MG/ML IJ SOLN: 30.0000 mg | Freq: Once | INTRAMUSCULAR | Status: DC | PRN

## 2019-08-26 MED ORDER — SODIUM CHLORIDE 0.9% FLUSH: 3.0000 mL | Freq: Two times a day (BID) | INTRAVENOUS | Status: DC

## 2019-08-26 MED ORDER — ACETAMINOPHEN 500 MG PO TABS
ORAL_TABLET | ORAL | Status: AC
  Filled 2019-08-26: qty 2

## 2019-08-26 MED ORDER — ACETAMINOPHEN 325 MG PO TABS: 325.0000 mg | ORAL_TABLET | ORAL | Status: DC | PRN

## 2019-08-26 MED ORDER — BUPIVACAINE-EPINEPHRINE 0.5% -1:200000 IJ SOLN
INTRAMUSCULAR | Status: DC | PRN
  Administered 2019-08-26: 20 mL

## 2019-08-26 MED ORDER — DEXAMETHASONE SODIUM PHOSPHATE 10 MG/ML IJ SOLN
INTRAMUSCULAR | Status: AC
  Filled 2019-08-26: qty 1

## 2019-08-26 MED ORDER — FENTANYL CITRATE (PF) 100 MCG/2ML IJ SOLN
INTRAMUSCULAR | Status: AC
  Filled 2019-08-26: qty 2

## 2019-08-26 MED ORDER — SODIUM CHLORIDE 0.9% FLUSH: 3.0000 mL | INTRAVENOUS | Status: DC | PRN

## 2019-08-26 MED ORDER — ACETAMINOPHEN 325 MG PO TABS: 650.0000 mg | ORAL_TABLET | ORAL | Status: DC | PRN

## 2019-08-26 MED ORDER — KETOROLAC TROMETHAMINE 30 MG/ML IJ SOLN
INTRAMUSCULAR | Status: AC
  Filled 2019-08-26: qty 1

## 2019-08-26 MED ORDER — ACETAMINOPHEN 325 MG RE SUPP: 650.0000 mg | RECTAL | Status: DC | PRN

## 2019-08-26 MED ORDER — GABAPENTIN 300 MG PO CAPS
ORAL_CAPSULE | ORAL | Status: AC
  Filled 2019-08-26: qty 1

## 2019-08-26 MED ORDER — LIDOCAINE 5 % EX OINT
TOPICAL_OINTMENT | CUTANEOUS | Status: DC | PRN
  Administered 2019-08-26: 1

## 2019-08-26 MED ORDER — PROPOFOL 500 MG/50ML IV EMUL
INTRAVENOUS | Status: DC | PRN
  Administered 2019-08-26: 50 ug/kg/min via INTRAVENOUS

## 2019-08-26 MED ORDER — MIDAZOLAM HCL 2 MG/2ML IJ SOLN
INTRAMUSCULAR | Status: AC
  Filled 2019-08-26: qty 2

## 2019-08-26 MED ORDER — MIDAZOLAM HCL 5 MG/5ML IJ SOLN
INTRAMUSCULAR | Status: DC | PRN
  Administered 2019-08-26: 2 mg via INTRAVENOUS

## 2019-08-26 MED ORDER — GABAPENTIN 300 MG PO CAPS
300.0000 mg | ORAL_CAPSULE | ORAL | Status: AC
  Administered 2019-08-26: 300 mg via ORAL

## 2019-08-26 MED ORDER — ACETAMINOPHEN 160 MG/5ML PO SOLN: 325.0000 mg | ORAL | Status: DC | PRN

## 2019-08-26 MED ORDER — FENTANYL CITRATE (PF) 100 MCG/2ML IJ SOLN
INTRAMUSCULAR | Status: DC | PRN
  Administered 2019-08-26 (×4): 25 ug via INTRAVENOUS

## 2019-08-26 MED ORDER — OXYCODONE HCL 5 MG/5ML PO SOLN: 5.0000 mg | Freq: Once | ORAL | Status: DC | PRN

## 2019-08-26 MED ORDER — FENTANYL CITRATE (PF) 100 MCG/2ML IJ SOLN: 25.0000 ug | INTRAMUSCULAR | Status: DC | PRN

## 2019-08-26 MED ORDER — LIDOCAINE HCL (CARDIAC) PF 100 MG/5ML IV SOSY
PREFILLED_SYRINGE | INTRAVENOUS | Status: DC | PRN
  Administered 2019-08-26: 60 mg via INTRAVENOUS

## 2019-08-26 MED ORDER — LACTATED RINGERS IV SOLN: INTRAVENOUS | Status: DC

## 2019-08-26 MED ORDER — KETOROLAC TROMETHAMINE 30 MG/ML IJ SOLN
INTRAMUSCULAR | Status: DC | PRN
  Administered 2019-08-26: 30 mg via INTRAVENOUS

## 2019-08-26 MED ORDER — ONDANSETRON HCL 4 MG/2ML IJ SOLN: 4.0000 mg | Freq: Once | INTRAMUSCULAR | Status: DC | PRN

## 2019-08-26 MED ORDER — DEXAMETHASONE SODIUM PHOSPHATE 4 MG/ML IJ SOLN
INTRAMUSCULAR | Status: DC | PRN
  Administered 2019-08-26: 5 mg via INTRAVENOUS

## 2019-08-26 MED ORDER — PROPOFOL 500 MG/50ML IV EMUL
INTRAVENOUS | Status: AC
  Filled 2019-08-26: qty 50

## 2019-08-26 SURGICAL SUPPLY — 47 items
BENZOIN TINCTURE PRP APPL 2/3 (GAUZE/BANDAGES/DRESSINGS) ×3 IMPLANT
BLADE CLIPPER SENSICLIP SURGIC (BLADE) IMPLANT
BLADE SURG 15 STRL LF DISP TIS (BLADE) IMPLANT
BLADE SURG 15 STRL SS (BLADE)
BRIEF STRETCH FOR OB PAD LRG (UNDERPADS AND DIAPERS) ×3 IMPLANT
CHLORAPREP W/TINT 26 (MISCELLANEOUS) IMPLANT
CLOSURE WOUND 1/2 X4 (GAUZE/BANDAGES/DRESSINGS)
COVER BACK TABLE 60X90IN (DRAPES) ×3 IMPLANT
COVER MAYO STAND STRL (DRAPES) ×3 IMPLANT
COVER WAND RF STERILE (DRAPES) ×3 IMPLANT
DECANTER SPIKE VIAL GLASS SM (MISCELLANEOUS) IMPLANT
DERMABOND ADVANCED (GAUZE/BANDAGES/DRESSINGS) ×2
DERMABOND ADVANCED .7 DNX12 (GAUZE/BANDAGES/DRESSINGS) ×1 IMPLANT
DRAPE HYSTEROSCOPY (DRAPE) IMPLANT
DRAPE LAPAROTOMY 100X72 PEDS (DRAPES) ×3 IMPLANT
DRAPE SHEET LG 3/4 BI-LAMINATE (DRAPES) ×3 IMPLANT
DRAPE UTILITY XL STRL (DRAPES) ×3 IMPLANT
DRSG PAD ABDOMINAL 8X10 ST (GAUZE/BANDAGES/DRESSINGS) ×3 IMPLANT
DRSG TEGADERM 4X4.75 (GAUZE/BANDAGES/DRESSINGS) IMPLANT
ELECT REM PT RETURN 9FT ADLT (ELECTROSURGICAL) ×3
ELECTRODE REM PT RTRN 9FT ADLT (ELECTROSURGICAL) ×1 IMPLANT
GAUZE SPONGE 4X4 12PLY STRL (GAUZE/BANDAGES/DRESSINGS) ×3 IMPLANT
GAUZE SPONGE 4X4 12PLY STRL LF (GAUZE/BANDAGES/DRESSINGS) ×3 IMPLANT
GLOVE BIO SURGEON STRL SZ 6.5 (GLOVE) ×6 IMPLANT
GLOVE BIO SURGEONS STRL SZ 6.5 (GLOVE) ×3
GLOVE BIOGEL PI IND STRL 7.0 (GLOVE) ×1 IMPLANT
GLOVE BIOGEL PI INDICATOR 7.0 (GLOVE) ×2
GOWN STRL REUS W/TWL 2XL LVL3 (GOWN DISPOSABLE) ×9 IMPLANT
KIT SIGMOIDOSCOPE (SET/KITS/TRAYS/PACK) IMPLANT
KIT TURNOVER CYSTO (KITS) ×3 IMPLANT
LEGGING LITHOTOMY PAIR STRL (DRAPES) IMPLANT
NEEDLE HYPO 22GX1.5 SAFETY (NEEDLE) ×3 IMPLANT
NS IRRIG 500ML POUR BTL (IV SOLUTION) ×3 IMPLANT
PAD ARMBOARD 7.5X6 YLW CONV (MISCELLANEOUS) IMPLANT
PENCIL BUTTON HOLSTER BLD 10FT (ELECTRODE) ×3 IMPLANT
STRIP CLOSURE SKIN 1/2X4 (GAUZE/BANDAGES/DRESSINGS) IMPLANT
SUT CHROMIC 3 0 SH 27 (SUTURE) ×6 IMPLANT
SUT VIC AB 2-0 SH 27 (SUTURE)
SUT VIC AB 2-0 SH 27XBRD (SUTURE) IMPLANT
SYR CONTROL 10ML LL (SYRINGE) ×3 IMPLANT
TOWEL OR 17X26 10 PK STRL BLUE (TOWEL DISPOSABLE) ×3 IMPLANT
TRAY DSU PREP LF (CUSTOM PROCEDURE TRAY) ×3 IMPLANT
TUBE CONNECTING 12'X1/4 (SUCTIONS)
TUBE CONNECTING 12X1/4 (SUCTIONS) IMPLANT
UNDERPAD 30X30 (UNDERPADS AND DIAPERS) IMPLANT
WATER STERILE IRR 1000ML POUR (IV SOLUTION) ×3 IMPLANT
YANKAUER SUCT BULB TIP NO VENT (SUCTIONS) ×3 IMPLANT

## 2019-08-26 NOTE — Discharge Instructions (Addendum)

## 2019-08-26 NOTE — Op Note (Signed)
08/26/2019  10:10 AM  PATIENT:  Danielle Krueger  54 y.o. female  Patient Care Team: Chesley Noon, MD as PCP - General (Family Medicine)  PRE-OPERATIVE DIAGNOSIS:  ANAL SKIN TAG  POST-OPERATIVE DIAGNOSIS:  ANAL SKIN TAG  PROCEDURE:  EXCISION OF ANAL SKIN TAG   Surgeon(s): Leighton Ruff, MD  ASSISTANT: none   ANESTHESIA:   local and MAC  SPECIMEN:  Source of Specimen:  anterior anal skin tag  DISPOSITION OF SPECIMEN:  PATHOLOGY  COUNTS:  YES  PLAN OF CARE: Discharge to home after PACU  PATIENT DISPOSITION:  PACU - hemodynamically stable.  INDICATION: 54 year old female who presents to the office with complaints of an anterior anal skin tag.  On exam there was some abnormal appearing skin which I felt needed a biopsy.  I recommended excision in the operating room.   OR FINDINGS: Anterior anal skin tag, abnormal appearing  DESCRIPTION: the patient was identified in the preoperative holding area and taken to the OR where they were laid on the operating room table.  MAC anesthesia was induced without difficulty. The patient was then positioned in prone jackknife position with buttocks gently taped apart.  The patient was then prepped and draped in usual sterile fashion.  SCDs were noted to be in place prior to the initiation of anesthesia. A surgical timeout was performed indicating the correct patient, procedure, positioning and need for preoperative antibiotics.  A rectal block was performed using Marcaine with epinephrine.    I began with a digital rectal exam.  Sphincter tone was good.  There were no other lesions palpated.  I then placed a Hill-Ferguson anoscope into the anal canal and evaluated this completely.  No lesions were noted.  No hemorrhoid disease noted.  I excised the skin tag using Metzenbaum scissors.  The edges were reapproximated vertically using interrupted 3-0 chromic sutures.  The specimen was sent to pathology for further examination.  Hemostasis  was good.  Lidocaine ointment and dressing were applied.  The patient was awakened from anesthesia and sent to the postanesthesia care unit in stable condition.  All counts were correct per operating room staff.

## 2019-08-26 NOTE — Anesthesia Postprocedure Evaluation (Signed)
Anesthesia Post Note  Patient: Danielle Krueger  Procedure(s) Performed: EXCISION OF ANAL SKIN TAG (N/A Rectum)     Patient location during evaluation: PACU Anesthesia Type: MAC Level of consciousness: awake and sedated Pain management: pain level controlled Vital Signs Assessment: post-procedure vital signs reviewed and stable Respiratory status: spontaneous breathing Cardiovascular status: stable Postop Assessment: no apparent nausea or vomiting Anesthetic complications: no   No complications documented.  Last Vitals:  Vitals:   08/26/19 0759 08/26/19 1019  BP: 121/87   Pulse: 83   Resp: 20   Temp: 37.4 C 36.6 C  SpO2: 100% 100%    Last Pain:  Vitals:   08/26/19 1019  TempSrc:   PainSc: 0-No pain                 Huston Foley

## 2019-08-26 NOTE — Anesthesia Procedure Notes (Signed)
Procedure Name: MAC Date/Time: 08/26/2019 9:52 AM Performed by: Justice Rocher, CRNA Pre-anesthesia Checklist: Patient identified, Emergency Drugs available, Suction available, Patient being monitored and Timeout performed Patient Re-evaluated:Patient Re-evaluated prior to induction Oxygen Delivery Method: Simple face mask Preoxygenation: Pre-oxygenation with 100% oxygen Induction Type: IV induction Placement Confirmation: breath sounds checked- equal and bilateral,  CO2 detector and positive ETCO2

## 2019-08-26 NOTE — Interval H&P Note (Signed)
History and Physical Interval Note:  08/26/2019 7:32 AM  Danielle Krueger  has presented today for surgery, with the diagnosis of ANAL SKIN TAG.  The various methods of treatment have been discussed with the patient and family. After consideration of risks, benefits and other options for treatment, the patient has consented to  Procedure(s): EXCISION OF ANAL SKIN TAG (N/A) as a surgical intervention.  The patient's history has been reviewed, patient examined, no change in status, stable for surgery.  I have reviewed the patient's chart and labs.  Questions were answered to the patient's satisfaction.     Rosario Adie, MD  [facility]

## 2019-08-26 NOTE — Anesthesia Preprocedure Evaluation (Signed)
Anesthesia Evaluation  Patient identified by MRN, date of birth, ID band Patient awake    Reviewed: Allergy & Precautions, H&P , NPO status , Patient's Chart, lab work & pertinent test results, reviewed documented beta blocker date and time   History of Anesthesia Complications (+) PONV and history of anesthetic complications  Airway Mallampati: II  TM Distance: >3 FB Neck ROM: full    Dental no notable dental hx. (+) Teeth Intact   Pulmonary former smoker,    Pulmonary exam normal breath sounds clear to auscultation       Cardiovascular Exercise Tolerance: Good negative cardio ROS Normal cardiovascular exam Rhythm:regular Rate:Normal     Neuro/Psych    GI/Hepatic Neg liver ROS,   Endo/Other  negative endocrine ROS  Renal/GU negative Renal ROS  negative genitourinary   Musculoskeletal   Abdominal Normal abdominal exam  (+)   Peds  Hematology negative hematology ROS (+)   Anesthesia Other Findings  Chest pain   Eval ER 5/24 R/O normal ECG negative markers, ? anxiety Acne        Depressed     Broken collarbone        Dislocated shoulder   right  Complication of anesthesia   age 56ys, "could not breathe after anes started"     Reproductive/Obstetrics negative OB ROS                             Anesthesia Physical  Anesthesia Plan  ASA: II  Anesthesia Plan: General LMA and MAC   Post-op Pain Management:    Induction:   PONV Risk Score and Plan: 3 and Ondansetron, Dexamethasone and Midazolam  Airway Management Planned: Natural Airway, Simple Face Mask, Nasal Cannula and Mask  Additional Equipment: None  Intra-op Plan:   Post-operative Plan:   Informed Consent: I have reviewed the patients History and Physical, chart, labs and discussed the procedure including the risks, benefits and alternatives for the proposed anesthesia with the patient or authorized representative who has  indicated his/her understanding and acceptance.       Plan Discussed with: CRNA  Anesthesia Plan Comments:         Anesthesia Quick Evaluation

## 2019-08-26 NOTE — Transfer of Care (Signed)
Immediate Anesthesia Transfer of Care Note  Patient: Danielle Krueger  Procedure(s) Performed: Procedure(s) (LRB): EXCISION OF ANAL SKIN TAG (N/A)  Patient Location: PACU  Anesthesia Type: MAC  Level of Consciousness: awake, sedated, patient cooperative and responds to stimulation  Airway & Oxygen Therapy: Patient Spontanous Breathing and Patient on RA  Post-op Assessment: Report given to PACU RN, Post -op Vital signs reviewed and stable and Patient moving all extremities  Post vital signs: Reviewed and stable  Complications: No apparent anesthesia complications

## 2019-08-29 ENCOUNTER — Encounter (HOSPITAL_BASED_OUTPATIENT_CLINIC_OR_DEPARTMENT_OTHER): Payer: Self-pay | Admitting: General Surgery

## 2019-08-30 LAB — SURGICAL PATHOLOGY

## 2019-09-21 ENCOUNTER — Ambulatory Visit: Payer: Self-pay | Admitting: General Surgery

## 2019-11-22 ENCOUNTER — Other Ambulatory Visit (HOSPITAL_COMMUNITY)
Admission: RE | Admit: 2019-11-22 | Discharge: 2019-11-22 | Disposition: A | Discharge status: Home / Self Care | Payer: BC Managed Care – PPO | Source: Ambulatory Visit | Attending: General Surgery | Admitting: General Surgery

## 2019-11-22 DIAGNOSIS — Z01812 Encounter for preprocedural laboratory examination: Secondary | ICD-10-CM | POA: Insufficient documentation

## 2019-11-22 DIAGNOSIS — Z20822 Contact with and (suspected) exposure to covid-19: Secondary | ICD-10-CM | POA: Diagnosis not present

## 2019-11-22 LAB — SARS CORONAVIRUS 2 (TAT 6-24 HRS): SARS Coronavirus 2: NEGATIVE

## 2019-11-25 ENCOUNTER — Encounter (HOSPITAL_COMMUNITY): Payer: Self-pay | Admitting: General Surgery

## 2019-11-25 ENCOUNTER — Other Ambulatory Visit: Payer: Self-pay

## 2019-11-25 ENCOUNTER — Ambulatory Visit (HOSPITAL_COMMUNITY)
Admission: RE | Admit: 2019-11-25 | Discharge: 2019-11-25 | Disposition: A | Discharge status: Home / Self Care | Payer: BC Managed Care – PPO | Attending: General Surgery | Admitting: General Surgery

## 2019-11-25 ENCOUNTER — Encounter (HOSPITAL_COMMUNITY)
Admission: RE | Disposition: A | Discharge status: Home / Self Care | Payer: Self-pay | Source: Home / Self Care | Attending: General Surgery

## 2019-11-25 DIAGNOSIS — Z79899 Other long term (current) drug therapy: Secondary | ICD-10-CM | POA: Diagnosis not present

## 2019-11-25 DIAGNOSIS — Z8541 Personal history of malignant neoplasm of cervix uteri: Secondary | ICD-10-CM | POA: Diagnosis not present

## 2019-11-25 DIAGNOSIS — F419 Anxiety disorder, unspecified: Secondary | ICD-10-CM | POA: Insufficient documentation

## 2019-11-25 DIAGNOSIS — Z79890 Hormone replacement therapy (postmenopausal): Secondary | ICD-10-CM | POA: Diagnosis not present

## 2019-11-25 DIAGNOSIS — Z1211 Encounter for screening for malignant neoplasm of colon: Secondary | ICD-10-CM | POA: Diagnosis not present

## 2019-11-25 DIAGNOSIS — D125 Benign neoplasm of sigmoid colon: Secondary | ICD-10-CM | POA: Insufficient documentation

## 2019-11-25 DIAGNOSIS — Z87891 Personal history of nicotine dependence: Secondary | ICD-10-CM | POA: Diagnosis not present

## 2019-11-25 DIAGNOSIS — F329 Major depressive disorder, single episode, unspecified: Secondary | ICD-10-CM | POA: Diagnosis not present

## 2019-11-25 DIAGNOSIS — K219 Gastro-esophageal reflux disease without esophagitis: Secondary | ICD-10-CM | POA: Insufficient documentation

## 2019-11-25 DIAGNOSIS — J45909 Unspecified asthma, uncomplicated: Secondary | ICD-10-CM | POA: Diagnosis not present

## 2019-11-25 HISTORY — PX: POLYPECTOMY: SHX149

## 2019-11-25 HISTORY — PX: FLEXIBLE SIGMOIDOSCOPY: SHX5431

## 2019-11-25 HISTORY — PX: SIGMOIDOSCOPY: SUR1295

## 2019-11-25 HISTORY — PX: POLYPECTOMY: SHX5525

## 2019-11-25 SURGERY — SIGMOIDOSCOPY, FLEXIBLE
Anesthesia: Moderate Sedation

## 2019-11-25 MED ORDER — FENTANYL CITRATE (PF) 100 MCG/2ML IJ SOLN
INTRAMUSCULAR | Status: DC | PRN
  Administered 2019-11-25 (×2): 25 ug via INTRAVENOUS

## 2019-11-25 MED ORDER — MIDAZOLAM HCL (PF) 5 MG/ML IJ SOLN
INTRAMUSCULAR | Status: AC
  Filled 2019-11-25: qty 2

## 2019-11-25 MED ORDER — SODIUM CHLORIDE 0.9% FLUSH: 3.0000 mL | Freq: Two times a day (BID) | INTRAVENOUS | Status: DC

## 2019-11-25 MED ORDER — FENTANYL CITRATE (PF) 100 MCG/2ML IJ SOLN
INTRAMUSCULAR | Status: DC | PRN
  Administered 2019-11-25 (×3): 25 ug via INTRAVENOUS

## 2019-11-25 MED ORDER — DIPHENHYDRAMINE HCL 50 MG/ML IJ SOLN
INTRAMUSCULAR | Status: AC
  Filled 2019-11-25: qty 1

## 2019-11-25 MED ORDER — MIDAZOLAM HCL (PF) 5 MG/ML IJ SOLN
INTRAMUSCULAR | Status: DC | PRN
  Administered 2019-11-25: 1 mg via INTRAVENOUS

## 2019-11-25 MED ORDER — MIDAZOLAM HCL 2 MG/2ML IJ SOLN
INTRAMUSCULAR | Status: DC | PRN
  Administered 2019-11-25 (×2): 2 mg via INTRAVENOUS

## 2019-11-25 MED ORDER — FENTANYL CITRATE (PF) 100 MCG/2ML IJ SOLN
INTRAMUSCULAR | Status: AC
  Filled 2019-11-25: qty 4

## 2019-11-25 MED ORDER — MIDAZOLAM HCL (PF) 5 MG/ML IJ SOLN
INTRAMUSCULAR | Status: DC | PRN
  Administered 2019-11-25: 2 mg via INTRAVENOUS
  Administered 2019-11-25: 1 mg via INTRAVENOUS

## 2019-11-25 NOTE — Discharge Instructions (Addendum)
Colonoscopy, Adult, Care After This sheet gives you information about how to care for yourself after your procedure. Your doctor may also give you more specific instructions. If you have problems or questions, call your doctor. What can I expect after the procedure? After the procedure, it is common to have:  A small amount of blood in your poop (stool) for 24 hours.  Some gas.  Mild cramping or bloating in your belly (abdomen). Follow these instructions at home: Eating and drinking   Drink enough fluid to keep your pee (urine) pale yellow.  Follow instructions from your doctor about what you cannot eat or drink.  Return to your normal diet as told by your doctor. Avoid heavy or fried foods that are hard to digest. Activity  Rest as told by your doctor.  Do not sit for a long time without moving. Get up to take short walks every 1-2 hours. This is important. Ask for help if you feel weak or unsteady.  Return to your normal activities as told by your doctor. Ask your doctor what activities are safe for you. To help cramping and bloating:   Try walking around.  Put heat on your belly as told by your doctor. Use the heat source that your doctor recommends, such as a moist heat pack or a heating pad. ? Put a towel between your skin and the heat source. ? Leave the heat on for 20-30 minutes. ? Remove the heat if your skin turns bright red. This is very important if you are unable to feel pain, heat, or cold. You may have a greater risk of getting burned. General instructions  For the first 24 hours after the procedure: ? Do not drive or use machinery. ? Do not sign important documents. ? Do not drink alcohol. ? Do your daily activities more slowly than normal. ? Eat foods that are soft and easy to digest.  Take over-the-counter or prescription medicines only as told by your doctor.  Keep all follow-up visits as told by your doctor. This is important. Contact a doctor  if:  You have blood in your poop 2-3 days after the procedure. Get help right away if:  You have more than a small amount of blood in your poop.  You see large clumps of tissue (blood clots) in your poop.  Your belly is swollen.  You feel like you may vomit (nauseous).  You vomit.  You have a fever.  You have belly pain that gets worse, and medicine does not help your pain. Summary  After the procedure, it is common to have a small amount of blood in your poop. You may also have mild cramping and bloating in your belly.  For the first 24 hours after the procedure, do not drive or use machinery, do not sign important documents, and do not drink alcohol.  Get help right away if you have a lot of blood in your poop, feel like you may vomit, have a fever, or have more belly pain. This information is not intended to replace advice given to you by your health care provider. Make sure you discuss any questions you have with your health care provider. Document Revised: 09/27/2018 Document Reviewed: 09/27/2018 Elsevier Patient Education  [place] [facility] Instructions  1. DIET: Follow a light bland diet the first 24 hours after arrival home, such as soup, liquids, crackers, etc.  Be sure to include lots of fluids daily.  Avoid fast food or heavy  meals as your are more likely to get nauseated.   2. You may have some mild rectal bleeding for the first few days after the procedure.  This should get less and less with time.  Resume any blood thinners 2 days after your procedure unless directed otherwise by your physician. 3. Take your usually prescribed home medications unless otherwise directed. a. If you have any pain, it is helpful to get up and walk around, as it is usually from excess gas. b. If this is not helpful, you can take an over-the-counter pain medication.  Choose one of the following that works best for you: i. Naproxen (Aleve, etc)  Two 220mg  tabs twice a  day ii. Ibuprofen (Advil, etc) Three 200mg  tabs four times a day (every meal & bedtime) iii. If you still have pain after using one of these, please call the office 4. It is normal to not have a bowel movement for 2-3 days after colonoscopy.    5. ACTIVITIES as tolerated:   6. You may resume regular (light) daily activities beginning the next day--such as daily self-care, walking, climbing stairs--gradually increasing activities as tolerated.    WHEN TO CALL us [phone]: 1. Fever over 101.5 F (38.5 C)  2. Severe abdominal or chest pain  3. Large amount of rectal bleeding, passing multiple blood clots  4. Dizziness or shortness of breath 5. Increasing nausea or vomiting   The clinic staff is available to answer your questions during regular business hours (8:30am-5pm).  Please don't hesitate to call and ask to speak to one of our nurses for clinical concerns.   If you have a medical emergency, go to the nearest emergency room or call 911.  A surgeon from [facility] is always on call at the [facility], [address], [place], [place], [place]  [address] ? MAIN: [phone]) [phone] ? TOLL FREE: [phone] ?  FAX [phone] [email or web address]

## 2019-11-25 NOTE — Op Note (Addendum)
[facility] Patient Name: Danielle Krueger Procedure Date: 11/25/2019 MRN: [id number] Attending MD: Leighton Ruff , MD Date of Birth: 1965/09/26 CSN: [id number] Age: 54 Admit Type: Outpatient Procedure:                Flexible Sigmoidoscopy Indications:              Screening for colorectal malignant neoplasm Providers:                Leighton Ruff, MD, Clyde Lundborg, RN, Cleda Daub, RN, Tyrone Apple, Technician Referring MD:              Medicines:                Fentanyl 125 micrograms IV, Midazolam 8 mg IV Complications:            No immediate complications. Estimated blood loss:                            Minimal. Estimated Blood Loss:     Estimated blood loss was minimal. Procedure:                Pre-Anesthesia Assessment:                           - Prior to the procedure, a History and Physical                            was performed, and patient medications and                            allergies were reviewed. The patient's tolerance of                            previous anesthesia was also reviewed. The risks                            and benefits of the procedure and the sedation                            options and risks were discussed with the patient.                            All questions were answered, and informed consent                            was obtained. Prior Anticoagulants: The patient has                            taken no previous anticoagulant or antiplatelet                            agents. ASA Grade Assessment: II - A patient with  mild systemic disease. After reviewing the risks                            and benefits, the patient was deemed in                            satisfactory condition to undergo the procedure.                           The procedure was aborted due to significant                            patient discomfort. The colonscope was not  inserted                            past the distal transverse colon. Medications were                            given to the point of safety, but patient was not                            able to be sedated adequately. The quality of the                            bowel preparation was excellent. Scope In: 8:05:44 AM Scope Out: 8:29:49 AM Total Procedure Duration: 0 hours 24 minutes 5 seconds  Findings:      The perianal and digital rectal examinations were normal.      A 5 mm polyp was found in the proximal sigmoid colon. The polyp was       sessile. The polyp was removed with a cold snare. Resection and       retrieval were complete. Estimated blood loss was minimal.      A 8 mm polyp was found in the distal sigmoid colon. The polyp was       semi-pedunculated. The polyp was removed with a cold snare. Resection       and retrieval were complete. Impression:               - One 5 mm polyp in the proximal sigmoid colon,                            removed with a cold snare. Resected and retrieved.                           - One 8 mm polyp in the distal sigmoid colon,                            removed with a cold snare. Resected and retrieved. Moderate Sedation:      Moderate (conscious) sedation was administered by the endoscopy nurse       and supervised by the endoscopist. The patient's oxygen saturation,       heart rate, blood pressure and response to care were monitored. Recommendation:           - Repeat colonoscopy in  1 month because the                            examination was incomplete. Procedure Code(s):        --- Professional ---                           [phone], Sigmoidoscopy, flexible; with biopsy, single                            or multiple Diagnosis Code(s):        --- Professional ---                           Z12.11, Encounter for screening for malignant                            neoplasm of colon                           K63.5, Polyp of colon CPT  copyright 2019 American Medical Association. All rights reserved. The codes documented in this report are preliminary and upon coder review may  be revised to meet current compliance requirements. Leighton Ruff, MD Leighton Ruff, MD 7/89/7847 8:41:38 AM This report has been signed electronically. Number of Addenda: 0

## 2019-11-25 NOTE — H&P (Signed)
The patient is a 54 year old female who presents with a complaint of anal problems.  54 year old female who presents to the office for evaluation of an anal skin tag. She has noticed this for many years. She has a history of laser ablation for precancerous lesions and is concerned that this may be another lesion like that. She denies any rectal bleeding. She has not had a screening colonoscopy. Her bowel habits are regular, and she takes a fiber supplement daily. She denies any straining.   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 Oral Surgery Shoulder Surgery Right.  Diagnostic Studies History Leighton Ruff, MD; 3/78/5885 4:20 PM) Colonoscopy never Mammogram 1-3 years ago Pap Smear 1-5 years ago  Allergies (Tanisha A. Owens Shark, [place]; 08/01/2019 4:07 PM) MetroNIDAZOLE *DERMATOLOGICALS* Allergies Reconciled  Medication History (Tanisha A. Owens Shark, [place]; 08/01/2019 4:09 PM) busPIRone HCl (10MG  Tablet, Oral) Active. Progesterone Micronized (100MG  Capsule, Oral) Active. LORazepam (0.5MG  Tablet, Oral) Active. Baclofen (10MG  Tablet, Oral) Active. Baclofen (20MG  Tablet, Oral) Active. Desvenlafaxine ER (100MG  Tablet ER 24HR, Oral) Active. cloNIDine HCl (0.3MG  Tablet, Oral) Active. Medications Reconciled  Social History Leighton Ruff, MD; 0/27/7412 4:20 PM) Alcohol use Occasional alcohol use. Caffeine use Carbonated beverages, Coffee, Tea. No drug use Tobacco use Former smoker.  Family History Leighton Ruff, MD; 8/78/6767 4:20 PM) Alcohol Abuse Brother, Son. Anesthetic complications Family Members In General. Arthritis Family Members In General, Father, Mother. Breast Cancer Mother. Cancer Brother. Cerebrovascular Accident Father. Colon Polyps Family Members In  General. Depression Brother, Family Members In [place], Father, Son. Heart Disease Father, Mother. Heart disease in female family member before age 62 Heart disease in female family member before age 60 Hypertension Father, Mother. Migraine Headache Mother, Son. Thyroid problems Father.  Pregnancy / Birth History Leighton Ruff, MD; 04/26/4707 4:20 PM) Age at menarche 28 years. Age of menopause 76-50 Contraceptive History Intrauterine device, Oral contraceptives. Gravida 2 Length (months) of breastfeeding 3-6 Maternal age 22-30 Para 2  Other Problems Leighton Ruff, MD; 09/11/3660 4:20 PM) Anxiety Disorder Asthma Back Pain Cervical Cancer Chest pain Depression Gastroesophageal Reflux Disease General anesthesia - complications Heart murmur Hemorrhoids Migraine Headache Oophorectomy Bilateral. SCREENING FOR COLORECTAL CANCER (Z12.11, Z12.12)     Review of Systems  General Not Present- Appetite Loss, Chills, Fatigue, Fever, Night Sweats, Weight Gain and Weight Loss. Skin Present- Dryness. Not Present- Change in Wart/Mole, Hives, Jaundice, New Lesions, Non-Healing Wounds, Rash and Ulcer. HEENT Present- Seasonal Allergies and Wears glasses/contact lenses. Not Present- Earache, Hearing Loss, Hoarseness, Nose Bleed, Oral Ulcers, Ringing in the Ears, Sinus Pain, Sore Throat, Visual Disturbances and Yellow Eyes. Respiratory Present- Snoring. Not Present- Bloody sputum, Chronic Cough, Difficulty Breathing and Wheezing. Cardiovascular Not Present- Chest Pain, Difficulty Breathing Lying Down, Leg Cramps, Palpitations, Rapid Heart Rate, Shortness of Breath and Swelling of Extremities. Gastrointestinal Present- Excessive gas and Hemorrhoids. Not Present- Abdominal Pain, Bloating, Bloody Stool, Change in Bowel Habits, Chronic diarrhea, Constipation, Difficulty Swallowing, Gets full quickly at meals, Indigestion, Nausea, Rectal Pain and Vomiting. Female  Genitourinary Present- Frequency. Not Present- Nocturia, Painful Urination, Pelvic Pain and Urgency. Musculoskeletal Present- Joint Stiffness. Not Present- Back Pain, Joint Pain, Muscle Pain, Muscle Weakness and Swelling of Extremities. Neurological Present- Numbness. Not Present- Decreased Memory, Fainting, Headaches, Seizures, Tingling, Tremor, Trouble walking and Weakness. Psychiatric Present- Anxiety. Not Present- Bipolar, Change in Sleep Pattern, Depression, Fearful and Frequent  crying. Endocrine Present- Hot flashes. Not Present- Cold Intolerance, Excessive Hunger, Hair Changes, Heat Intolerance and New Diabetes.  BP (!) 143/86   Pulse 69   Temp 98.1 F (36.7 C) (Oral)   Resp 12   Ht 5' 5.5" (1.664 m)   Wt 79.8 kg   LMP 12/21/2014   SpO2 100%   BMI 28.84 kg/m    Physical Exam   General Mental Status-Alert. General Appearance-Not in acute distress. Build & Nutrition-Well nourished. Posture-Normal posture. Gait-Normal.  Head and Neck Head-normocephalic, atraumatic with no lesions or palpable masses. Trachea-midline.  Chest and Lung Exam Chest and lung exam reveals -on auscultation, normal breath sounds, no adventitious sounds and normal vocal resonance.  Cardiovascular Cardiovascular examination reveals -normal heart sounds, regular rate and rhythm with no murmurs and no digital clubbing, cyanosis, edema, increased warmth or tenderness.  Abdomen Inspection Inspection of the abdomen reveals - No Hernias. Palpation/Percussion Palpation and Percussion of the abdomen reveal - Soft, Non Tender, No Rigidity (guarding), No hepatosplenomegaly and No Palpable abdominal masses.   Musculoskeletal Normal Exam - Bilateral-Upper Extremity Strength Normal and Lower Extremity Strength Normal.    Assessment & Plan  53 yo F with need for screening colonoscopy, average risk.  We have discussed colonoscopy in detail, including risks of bleeding,  perforation, missed pathology and need for additional procedures.  I believe she understands this and agrees to proceed.    Rosario Adie, MD  [facility]

## 2019-11-28 ENCOUNTER — Other Ambulatory Visit: Payer: Self-pay | Admitting: Obstetrics and Gynecology

## 2019-11-28 DIAGNOSIS — Z1231 Encounter for screening mammogram for malignant neoplasm of breast: Secondary | ICD-10-CM

## 2019-11-28 LAB — SURGICAL PATHOLOGY

## 2019-11-29 ENCOUNTER — Encounter (HOSPITAL_COMMUNITY): Payer: Self-pay | Admitting: General Surgery

## 2019-12-01 ENCOUNTER — Other Ambulatory Visit: Payer: Self-pay

## 2019-12-01 ENCOUNTER — Ambulatory Visit (AMBULATORY_SURGERY_CENTER): Payer: Self-pay | Admitting: *Deleted

## 2019-12-01 VITALS — Ht 65.5 in | Wt 180.0 lb

## 2019-12-01 DIAGNOSIS — Z86010 Personal history of colonic polyps: Secondary | ICD-10-CM

## 2019-12-01 MED ORDER — SUTAB 1479-225-188 MG PO TABS: 24.0000 | ORAL_TABLET | ORAL | 0 refills | Status: DC

## 2019-12-01 NOTE — Progress Notes (Signed)
cov   vax  X 2   No egg or soy allergy known to patient  issues with past sedation with any surgeries or procedures of PONV, age 54 yrs couldn't breathe after sedation started  But none of these issues since  no intubation problems in the past  No FH of Malignant Hyperthermia No diet pills per patient No home 02 use per patient  No blood thinners per patient  Pt denies issues with constipation  No A fib or A flutter  EMMI video to pt or via [place] 19 guidelines implemented in [place] today with Pt and RN   Pt had a colon 11-25-2019 at [facility] with Dr Marcello Moores- only completed Flex due to F/V sedation, pt woke up and was screaming- referred here for MAC- hx colon polyps-    Sutab  Coupon given to pt in [place] today , Code to Pharmacy   Due to the COVID-19 pandemic we are asking patients to follow these guidelines. Please only bring one care partner. Please be aware that your care partner may wait in the car in the parking lot or if they feel like they will be too hot to wait in the car, they may wait in the lobby on the 4th floor. All care partners are required to wear a mask the entire time (we do not have any that we can provide them), they need to practice social distancing, and we will do a Covid check for all patient's and care partners when you arrive. Also we will check their temperature and your temperature. If the care partner waits in their car they need to stay in the parking lot the entire time and we will call them on their cell phone when the patient is ready for discharge so they can bring the car to the front of the building. Also all patient's will need to wear a mask into building.

## 2019-12-05 ENCOUNTER — Encounter: Payer: Self-pay | Admitting: Gastroenterology

## 2019-12-13 ENCOUNTER — Ambulatory Visit
Admission: RE | Admit: 2019-12-13 | Discharge: 2019-12-13 | Disposition: A | Discharge status: Home / Self Care | Payer: BC Managed Care – PPO | Source: Ambulatory Visit | Attending: Obstetrics and Gynecology | Admitting: Obstetrics and Gynecology

## 2019-12-13 ENCOUNTER — Other Ambulatory Visit: Payer: Self-pay

## 2019-12-13 DIAGNOSIS — Z1231 Encounter for screening mammogram for malignant neoplasm of breast: Secondary | ICD-10-CM

## 2020-01-04 ENCOUNTER — Encounter: Payer: Self-pay | Admitting: Neurology

## 2020-01-06 ENCOUNTER — Encounter: Payer: BC Managed Care – PPO | Admitting: Gastroenterology

## 2020-01-18 ENCOUNTER — Other Ambulatory Visit: Payer: Self-pay

## 2020-01-18 ENCOUNTER — Encounter: Payer: Self-pay | Admitting: *Deleted

## 2020-01-18 ENCOUNTER — Ambulatory Visit (AMBULATORY_SURGERY_CENTER): Payer: BC Managed Care – PPO | Admitting: Gastroenterology

## 2020-01-18 ENCOUNTER — Encounter: Payer: Self-pay | Admitting: Gastroenterology

## 2020-01-18 VITALS — BP 151/83 | HR 83 | Temp 97.7°F | Resp 8 | Ht 65.5 in | Wt 180.0 lb

## 2020-01-18 DIAGNOSIS — Z86010 Personal history of colonic polyps: Secondary | ICD-10-CM

## 2020-01-18 DIAGNOSIS — Z538 Procedure and treatment not carried out for other reasons: Secondary | ICD-10-CM

## 2020-01-18 MED ORDER — SODIUM CHLORIDE 0.9 % IV SOLN: 500.0000 mL | INTRAVENOUS | Status: DC

## 2020-01-18 NOTE — Patient Instructions (Signed)
YOU HAD AN ENDOSCOPIC PROCEDURE TODAY AT THE [facility]:   Refer to the procedure report that was given to you for any specific questions about what was found during the examination.  If the procedure report does not answer your questions, please call your gastroenterologist to clarify.  If you requested that your care partner not be given the details of your procedure findings, then the procedure report has been included in a sealed envelope for you to review at your convenience later.  YOU SHOULD EXPECT: Some feelings of bloating in the abdomen. Passage of more gas than usual.  Walking can help get rid of the air that was put into your GI tract during the procedure and reduce the bloating. If you had a lower endoscopy (such as a colonoscopy or flexible sigmoidoscopy) you may notice spotting of blood in your stool or on the toilet paper. If you underwent a bowel prep for your procedure, you may not have a normal bowel movement for a few days.  Please Note:  You might notice some irritation and congestion in your nose or some drainage.  This is from the oxygen used during your procedure.  There is no need for concern and it should clear up in a day or so.  SYMPTOMS TO REPORT IMMEDIATELY:   Following lower endoscopy (colonoscopy or flexible sigmoidoscopy):  Excessive amounts of blood in the stool  Significant tenderness or worsening of abdominal pains  Swelling of the abdomen that is new, acute  Fever of 100F or higher  For urgent or emergent issues, a gastroenterologist can be reached at any hour by calling [phone]. Do not use MyChart messaging for urgent concerns.    DIET:  We do recommend a small meal at first, but then you may proceed to your regular diet.  Drink plenty of fluids but you should avoid alcoholic beverages for 24 hours.  ACTIVITY:  You should plan to take it easy for the rest of today and you should NOT DRIVE or use heavy machinery until tomorrow (because  of the sedation medicines used during the test).    FOLLOW UP: Our staff will call the number listed on your records 48-72 hours following your procedure to check on you and address any questions or concerns that you may have regarding the information given to you following your procedure. If we do not reach you, we will leave a message.  We will attempt to reach you two times.  During this call, we will ask if you have developed any symptoms of COVID 19. If you develop any symptoms (ie: fever, flu-like symptoms, shortness of breath, cough etc.) before then, please call [phone].  If you test positive for Covid 19 in the 2 weeks post procedure, please call and report this information to us.    If any biopsies were taken you will be contacted by phone or by letter within the next 1-3 weeks.  Please call us at [phone] if you have not heard about the biopsies in 3 weeks.    SIGNATURES/CONFIDENTIALITY: You and/or your care partner have signed paperwork which will be entered into your electronic medical record.  These signatures attest to the fact that that the information above on your After Visit Summary has been reviewed and is understood.  Full responsibility of the confidentiality of this discharge information lies with you and/or your care-partner. 

## 2020-01-18 NOTE — Op Note (Signed)
[place] Patient Name: Danielle Krueger Procedure Date: 01/18/2020 7:40 AM MRN: [id number] Endoscopist: Mauri Pole , MD Age: 54 Referring MD:  Date of Birth: 10-10-1965 Gender: Female Account #: [id number] Procedure:                Colonoscopy Indications:              High risk colon cancer surveillance: Personal                            history of adenoma less than 10 mm in size.                            Incomplete colonoscopy on last exam Medicines:                Monitored Anesthesia Care Procedure:                Pre-Anesthesia Assessment:                           - Prior to the procedure, a History and Physical                            was performed, and patient medications and                            allergies were reviewed. The patient's tolerance of                            previous anesthesia was also reviewed. The risks                            and benefits of the procedure and the sedation                            options and risks were discussed with the patient.                            All questions were answered, and informed consent                            was obtained. Prior Anticoagulants: The patient has                            taken no previous anticoagulant or antiplatelet                            agents. ASA Grade Assessment: II - A patient with                            mild systemic disease. After reviewing the risks                            and benefits, the patient was deemed in  satisfactory condition to undergo the procedure.                           After obtaining informed consent, the colonoscope                            was passed under direct vision. Throughout the                            procedure, the patient's blood pressure, pulse, and                            oxygen saturations were monitored continuously. The                            Colonoscope was  introduced through the anus and                            advanced to the the cecum, identified by                            appendiceal orifice and ileocecal valve. The                            colonoscopy was technically difficult and complex                            due to poor bowel prep with stool present. The                            patient tolerated the procedure well. The quality                            of the bowel preparation was poor. Scope In: 8:09:15 AM Scope Out: 8:17:19 AM Scope Withdrawal Time: 0 hours 0 minutes 43 seconds  Total Procedure Duration: 0 hours 8 minutes 4 seconds  Findings:                 The perianal and digital rectal examinations were                            normal.                           Extensive amounts of stool was found in the                            descending colon, in the transverse colon, in the                            ascending colon and in the cecum, precluding                            visualization. Lavage of the area was performed,  resulting in incomplete clearance with continued                            poor visualization. Complications:            No immediate complications. Estimated Blood Loss:     Estimated blood loss: none. Impression:               - Preparation of the colon was poor.                           - Stool in the descending colon, in the transverse                            colon, in the ascending colon and in the cecum.                           - No specimens collected. Recommendation:           - Patient has a contact number available for                            emergencies. The signs and symptoms of potential                            delayed complications were discussed with the                            patient. Return to normal activities tomorrow.                            Written discharge instructions were provided to the                             patient.                           - Resume previous diet.                           - Continue present medications.                           - Repeat colonoscopy at the next available                            appointment because the bowel preparation was                            suboptimal.                           - For future colonoscopy the patient will require                            an extended preparation. If there are any  questions, please contact the gastroenterologist. Mauri Pole, MD 01/18/2020 8:25:50 AM This report has been signed electronically.

## 2020-01-18 NOTE — Progress Notes (Signed)
Colonoscopy not completed due to poor prep results. Patient rescheduled for 02/24/20 with a virtual previsit scheduled for 02/17/20 @ 10:00 Patient given 2 day Sutab prep instructions and also sent copy through [facility]. Patient asked to have instructions available when we call her for the pre-visit. Sutab sample given

## 2020-01-18 NOTE — Progress Notes (Signed)
Vitals-CW  Patient did take Prestiq yesterday. Pt's states no medical or surgical changes since previsit or office visit.

## 2020-01-18 NOTE — Telephone Encounter (Signed)
Encounter opened in error

## 2020-01-18 NOTE — Progress Notes (Signed)
Report to PACU, RN, vss, BBS= Clear.  

## 2020-01-20 ENCOUNTER — Telehealth: Payer: Self-pay

## 2020-01-20 NOTE — Telephone Encounter (Signed)
Covid-19 screening questions   Do you now or have you had a fever in the last 14 days? No.  Do you have any respiratory symptoms of shortness of breath or cough now or in the last 14 days? No.  Do you have any family members or close contacts with diagnosed or suspected Covid-19 in the past 14 days? No.  Have you been tested for Covid-19 and found to be positive? No.       Follow up Call-  Call back number 01/18/2020  Post procedure Call Back phone  # [phone]  Permission to leave phone message Yes  Some recent data might be hidden     Patient questions:  Do you have a fever, pain , or abdominal swelling? No. Pain Score  0 *  Have you tolerated food without any problems? Yes.    Have you been able to return to your normal activities? Yes.    Do you have any questions about your discharge instructions: Diet   No. Medications  No. Follow up visit  No.  Do you have questions or concerns about your Care? No.  Actions: * If pain score is 4 or above: No action needed, pain <4.

## 2020-02-08 ENCOUNTER — Ambulatory Visit: Payer: BC Managed Care – PPO | Admitting: Neurology

## 2020-02-16 ENCOUNTER — Encounter: Payer: Self-pay | Admitting: Neurology

## 2020-02-16 ENCOUNTER — Ambulatory Visit: Payer: BC Managed Care – PPO | Admitting: Neurology

## 2020-02-16 ENCOUNTER — Other Ambulatory Visit: Payer: Self-pay

## 2020-02-16 VITALS — BP 128/78 | HR 92 | Ht 66.0 in | Wt 180.0 lb

## 2020-02-16 DIAGNOSIS — Z636 Dependent relative needing care at home: Secondary | ICD-10-CM | POA: Diagnosis not present

## 2020-02-16 DIAGNOSIS — G4731 Primary central sleep apnea: Secondary | ICD-10-CM

## 2020-02-16 DIAGNOSIS — Z9989 Dependence on other enabling machines and devices: Secondary | ICD-10-CM | POA: Diagnosis not present

## 2020-02-16 DIAGNOSIS — F952 Tourette's disorder: Secondary | ICD-10-CM | POA: Diagnosis not present

## 2020-02-16 NOTE — Progress Notes (Signed)
[facility]INE [facility]   Provider:  Larey Seat, MD  Primary Care Physician:  Danielle Noon, MD    Interval history for this established CSA and Tourette's syndrome patient on 02-16-2020: I have the pleasure to see Danielle Krueger. Danielle Krueger, today on 16 February 2020, she is an established and compliant patient and has used her BiPAP at 18/12 cmH2O with an ST rate of 12/min on 27 of the last 30 days there is a 90% compliance average user time 6 hours 41 minutes, AHI is 3.8 which is excellent, she does have mild air leakage no reason for concern.  She endorsed the fatigue severity score 33 and the Epworth Sleepiness Scale at only 1 point.   There has been a significant stressor in Danielle Krueger's life as her husband Danielle Krueger was diagnosed in 2020 with early onset Alzheimer's at age 52. He is now attending an adult memory care in daytime twice a week, her 53 year old mother helps with driving and she is full-time gainfully employed.  This is Danielle Krueger second marriage and he has 4 adult children from his first marriage , but the contact has been very sparse and almost contentious at this time. She feels very much alone in the caregiver role.  She is working for an Associate Professor, [employer] manager, she is an Optometrist. Some days of the week she can work from home.   Her husband is under care with Danielle Kr[facility]. Her father died of dementia and her brother died of a GBM.      Interval history from 10-13-2018. CD I have the pleasure to see Danielle Krueger. 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  This is a face-to-face visit dated 13 October 2018.  First I would like to summarize the patient's sleep study the patient has a history of loud snoring, Tourette's syndrome and hypertension she had a PSG performed on 09 Aug 2018 which resulted in an AHI of 22.6/h during REM sleep exacerbated to 66.3/h there was also supine  sleep position accentuation to an AHI of 39.3.  There were some central events noted which makes we will did describe in detail later.  Her total time this low oxygen desaturation was 19 minutes the nadir SPO2 was 81% oxygen saturation.   The patient returned on 21 July for an attended CPAP titration which was initiated at 5 cm water and step-by-step increased to 15 there was still an AHI of 3.5/h noted the technician changed to BiPAP modality beginning at 16/12 cmH2O but the patient could not initiate sleep on BiPAP at first and ST a reminder breath setting was initiated and she finally arrived at 18/13 cmH2O BiPAP with ST of 12/min and the AHI was now reduced to 2.4.  At the time of her titration the patient had actually already tried CPAP at home with an auto titration machine she used the machine 97% of the time for the last days of last night.   Average user time is 7 hours and 47 minutes the O2 sat allowed a pressure window from 5 cm minimum pressure to 13 cm maximum pressure, with 3 cm water pressure of expiratory pressure relief.  The residual AHI is high at 9.1 but the patient spent 95th percentile pressure at 13 the maximum setting.  There were 3 central apneas per 4.7 obstructive apneas noted and she also gained 7 minutes of Cheyne-Stokes respirations.  She felt great !  awake and energetic.   Given the results of her titration I would think that she tolerate BiPAP better and will have much less central apnea.  For this reason we will change her CPAP to a BiPAP with a respiratory capture of 12/min.   Virtual Visit via Video Note:  06-22-2018  HPI:  Danielle Krueger is a 54 y.o.caucasian, married , right handed female , and she has been seen here  in a referral from Dr. Melford Krueger for the evaluation of sleep apnea on 11-27-2016. She had described her husbands observation of "horrible snoring" , difficult time waking up in the morning , but fragmented sleep with up to 3 times waking up sitting up and  fighting for air. She has trouble to exhale not inhale ! She has used Ativan as a sleep aid for over 8 years. She now presents over 18 month after her initial visit as she has still not undergone an attended sleep study. She had undergone a HST which was not giving answers to her reported problems with organic parasomnia activity. Below are the notes and information I gathered at our last visit in person.     Sleep habits are as follows:Danielle Krueger will watch TV for 1 hour maybe 1-1/2 hours in her bedroom and usually has a TV in the background when she sleeps. Her bedroom is described as cool at about 70F with a ceiling fan running. Due to the TV it is neither quiet nor dark. Danielle Krueger shares a bedroom and she states that her husband, Danielle Krueger, is also wanting the TV in the background. The couple uses a sleep number bed, but it does not have an adjustable height. She sleeps usually on 2 pillows one a body pillow one is for head support, she sleeps on her side and avoiding the shoulder, she ends up in supine position.  She does not have trouble initiating sleep when she takes Ativan, and she has done so regularly. Most nights she is asleep by midnight but she is already in bed between 10 and 11 PM. There are rare nights where she will be up in spite of medication until the early morning hours. Usually she can sleep through them until she is woken by one of her choking attacks. She will then sit up and tries to catch her breath. She usually can go back to sleep after that. She rises in the morning at about 6:30 after hitting the snooze button up to 8 times. Danielle Krueger does not nap in daytime.  Sleep medical history : Tourette syndrome, facial tics. Trouble to exhale, horrible snoring. Night terrors.    Family sleep history: Mother has sleep apnea, is in the process form changing from CPAP to BiPAP. Father deceased , Danielle Krueger was a patient here,  had "horrific" central sleep apnea. Used  CPAP, but had CHF. He died 04/30/11  Brother died of GBM.04/29/[facility] at [facility] had alcoholism, mother has abuse problems, too.   Patient has undergone hysterectomy in 2014-04-29, and septoplasty in  04-29-1982 and again in1988. No tonsillectomy, sleep walking in childhood, night terrors. Still has some night terrors now at  Age 78 !  Tourette syndrome with clearing her throat- father had it, facial tics.Dx by Dr. Earley Favor in 2nd grade.   Social history: married, adult children - 6, some of them step children. Marland Kitchen, her youngest  biological son, Marland Kitchen, has Asperger's and is bipolar. He is now home from college during the [facility] 19  crisis. 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  No window. No tobacco use now , quit in 2012.  Alcohol- seldomly ( 1 in 6 month ) ,  Caffeine : 1 mug in AM, one for the road, 2 diet cokes a week, less than in our last visit.   Review of Systems: Out of a complete 14 system review, the patient complains of only the following symptoms, and all other reviewed systems are negative. Depression, anxiety, chronic benzodiazepine.   How likely are you to doze in the following situations: 0 = not likely, 1 = slight chance, 2 = moderate chance, 3 = high chance  Sitting and Reading? Watching Television? Sitting inactive in a public place (theater or meeting)? Lying down in the afternoon when circumstances permit? Sitting and talking to someone? Sitting quietly after lunch without alcohol? In a car, while stopped for a few minutes in traffic? As a passenger in a car for an hour without a break?  Total =1/ 24 on 02-16-2020  Fatigue severity score33/ 64,  depression score was not provided   High anxiety , high stress.  Feeling as if she has to remind herself to breathe  Tourette's controlled , muscle aches, and parasomnia-currently no night terrors.  Easily startled.    Social History   Socioeconomic History  . Marital status: Married     Spouse name: Not on file  . Number of children: Not on file  . Years of education: Not on file  . Highest education level: Not on file  Occupational History  . Not on file  Tobacco Use  . Smoking status: Former Smoker    Packs/day: 1.00    Years: 15.00    Pack years: 15.00    Quit date: 2011    Years since quitting: 10.9  . Smokeless tobacco: Never Used  Vaping Use  . Vaping Use: Never used  Substance and Sexual Activity  . Alcohol use: Yes    Comment: Very rare  . Drug use: No  . Sexual activity: Not on file  Other Topics Concern  . Not on file  Social History Narrative   6 children combined remarriage. Works for Editor, commissioning. Wants to work out at [employer].    Social Determinants of Health   Financial Resource Strain:   . Difficulty of Paying Living Expenses: Not on file  Food Insecurity:   . Worried About Charity fundraiser in the Last Year: Not on file  . Ran Out of Food in the Last Year: Not on file  Transportation Needs:   . Lack of Transportation (Medical): Not on file  . Lack of Transportation (Non-Medical): Not on file  Physical Activity:   . Days of Exercise per Week: Not on file  . Minutes of Exercise per Session: Not on file  Stress:   . Feeling of Stress : Not on file  Social Connections:   . Frequency of Communication with Friends and Family: Not on file  . Frequency of Social Gatherings with Friends and Family: Not on file  . Attends Religious Services: Not on file  . Active Member of Clubs or Organizations: Not on file  . Attends Archivist Meetings: Not on file  . Marital Status: Not on file  Intimate Partner Violence:   . Fear of Current or Ex-Partner: Not on file  . Emotionally Abused: Not on file  . Physically Abused: Not on file  . Sexually Abused: Not on file  Family History  Problem Relation Age of Onset  . Breast cancer Mother   . Heart attack Mother   . Sleep apnea Mother   . Sleep apnea Father   . Heart disease  Father   . Brain cancer Brother   . Colon polyps Maternal Grandmother   . Esophageal cancer Other   . Coronary artery disease Neg Hx        premature  . Colon cancer Neg Hx   . Rectal cancer Neg Hx   . Stomach cancer Neg Hx     Past Medical History:  Diagnosis Date  . Acne   . Allergy    mild   . Asthma    no issues in 20 years  . Broken collarbone   . Chest pain    Eval ER 5/24 R/O normal ECG negative markers, ? anxiety  . Chronic bronchitis ([place])   . Complication of anesthesia    age 44ys, "could not breathe after anes started"   . DDD (degenerative disc disease), lumbar   . Depressed   . Dislocated shoulder    right   . GAD (generalized anxiety disorder)   . GERD (gastroesophageal reflux disease)    remote history   . Headache    stress hedaches  . Heart murmur    has closed  . Hyperlipidemia   . Neuromuscular disorder ([place])    Tourette's syndrome- mild tics, no verbal issues   . OSA treated with BiPAP   . Pneumonia    remote history   . PONV (postoperative nausea and vomiting)   . Rosacea   . Sleep apnea    wears BIPAP q night   . Tourette's    eye blinking    Past Surgical History:  Procedure Laterality Date  . ABDOMINAL HYSTERECTOMY  2016  . BANKHART REPAIR SHOULDER Right   . EXCISION OF SKIN TAG N/A 08/26/2019   Procedure: EXCISION OF ANAL SKIN TAG;  Surgeon: Leighton Ruff, MD;  [facility];  Service: General;  Laterality: N/A;  . FLEXIBLE SIGMOIDOSCOPY N/A 11/25/2019   Procedure: FLEXIBLE SIGMOIDOSCOPY;  Surgeon: Leighton Ruff, MD;  Location: [facility];  Service: Endoscopy;  Laterality: N/A;  IV SEDATION BY SURGEON  . KNEE ARTHROSCOPY Left    x2  . laser of vulva    . MOUTH SURGERY    . POLYPECTOMY  11/25/2019   Procedure: POLYPECTOMY;  Surgeon: Leighton Ruff, MD;  Location: [facility];  Service: Endoscopy;;  . POLYPECTOMY  11/25/2019   TA x 2     . RHINOPLASTY     age 51yr  . SIGMOIDOSCOPY  11/25/2019    incomplete colon , did flex , 2 polyps-  rescheduled colon with MAC   . uterine ablation      Current Outpatient Medications  Medication Sig Dispense Refill  . ampicillin (PRINCIPEN) 500 MG capsule Take 500 mg by mouth 2 (two) times daily.    .Marland Kitchenaspirin-acetaminophen-caffeine (EXCEDRIN MIGRAINE) 250-250-65 MG tablet Take 2 tablets by mouth every 6 (six) hours as needed for headache.    . baclofen (LIORESAL) 20 MG tablet Take 20 mg by mouth 3 (three) times daily.   0  . busPIRone (BUSPAR) 10 MG tablet Take 10 mg by mouth 2 (two) times daily.    . cloNIDine (CATAPRES) 0.1 MG tablet Take 0.1 mg by mouth 3 (three) times daily.    .Marland Kitchendesvenlafaxine (PRISTIQ) 100 MG 24 hr tablet Take 200 mg by mouth daily.     .Marland Kitchen  LORazepam (ATIVAN) 0.5 MG tablet Take 0.5 mg by mouth 3 (three) times daily as needed for anxiety. sleep     . progesterone (PROMETRIUM) 100 MG capsule Take 100 mg by mouth every evening.     . rosuvastatin (CRESTOR) 10 MG tablet Take 10 mg by mouth every evening.      Current Facility-Administered Medications  Medication Dose Route Frequency Provider Last Rate Last Admin  . 0.9 %  sodium chloride infusion  500 mL Intravenous Continuous Mauri Pole, MD        Allergies as of 02/16/2020 - Review Complete 02/16/2020  Allergen Reaction Noted  . Metronidazole Rash     Vitals: BP 128/78   Pulse 92   Ht _0  (1.676 m)   Wt 180 lb (81.6 kg)   LMP 12/21/2014   BMI 29.05 kg/m  Last Weight:   Wt Readings from Last 1 Encounters:  02/16/20 180 lb (81.6 kg)   CWC:BJSE mass index is 29.05 kg/m.  Last Height:    Ht Readings from Last 1 Encounters:  02/16/20 _1  (1.676 m)   Physical exam: General: The patient is awake, alert and appears not in acute distress. The patient is well groomed. Head: Normocephalic, atraumatic. Neck is supple. Mallampati 1   ,  neck circumference:15.75 ". Nasal airflow appears restricted , she reports TMJ click . Retrognathia is not seen.    Respiratory: no evidence of SOB. Skin:  Reports no evidence of edema, or rash Trunk: BMI is 29 from 27 in ut last visit.  The patient's posture is erect.   The patient is awake and alert, oriented to place and time.    Attention span & concentration ability appears normal.  Speech is fluent, without dysarthria, dysphonia , she speaks clearly but slowly- some word finding may be delayed / aphasia. Mood and affect are appropriate. Cranial nerves: Pupils are equal .  She does blink with her left eye frequently but has no associated vocalization. Facial motor strength is symmetric and her tongue and uvula move in midline.  Shoulder shrug was symmetrical.  I did not witness a facial tic during this video guided conversation  Motor exam:   normal muscle bulk and symmetric ROM in all extremities.  Coordination: Movements in the fingers/hands were normal. She does have a repeated tic of rubbing her index and middle finger together- handwriting remains unchanged. Gait and station: Patient walks without assistive device. No cane. No fragmented turns. No balance issues.  DTR- 2 plus upper and 1plus level in  lower extremities  Assessment:   35 minutes face to face visit with  review of pre-existing records as far as provided., my assessment is :    1)  CSA_ Had central apneas on auto CPAP, and a high AHI of 9.1/h. attended sleep study revealed  central and complex sleep apnea, and she tolerated BiPAP 18/ 12 cm water pressure ST 12. She continues to use the device with high compliance.   2) night terrors-   Controlled ! Going on for a decade now, initially reduced under ativan but still present.. she is allowed to continue Ativan and should continue to take Pristiiq.   2) insomnia is much improved on BiPAP ST  There are no more sleep choking episodes(  the percievd blocking of the airway when exhaling, a scary sensation that woke her frequently).   3) Tourette syndrome, controlled  on Clonidine tid  and Baclofen. Prescribed by Dr Danielle Krueger. I suggest we change to  a patch of clonidin as this gives more reliable round the clock blood levels. She reduced baclofen at bedtime to not foster central apnea.  She still takes nicotine lozenges.     I discussed the assessment and treatment plan with the patient. The patient was provided an opportunity to ask questions and all were answered. The patient agreed with the plan and demonstrated an understanding of the instructions.   The patient was advised to call back or seek an in-person evaluation if the symptoms worsen or if the condition fails to improve as anticipated.  RV in 12 month with Np alternating with me.    Danielle Seat, MD  [id number] 5:40 AM  Certified in Neurology by ABPN Certified in Sleep Medicine by [facility] [address], [place], [place] [address]    Lieutenant Diego, MD

## 2020-02-16 NOTE — Patient Instructions (Signed)
Alzheimer Disease Caregiver Guide  Alzheimer disease causes a person to lose the ability to remember things and make decisions. A person who has Alzheimer disease may not be able to take care of himself or herself. He or she may need help with simple tasks. The tips below can help you care for the person. What kind of changes does this condition cause? This condition makes a person:  Forget things.  Feel confused.  Act differently.  Have different moods. These things get worse with time. Tips to help with symptoms  Be calm and patient.  Respond with a simple, short answer.  Avoid correcting the person in a negative way.  Try not to take things personally, even if the person forgets your name.  Do not argue with the person. This may make the person more upset. Tips to lessen frustration  Make appointments and do daily tasks when the person is at his or her best.  Take your time. Simple tasks may take longer. Allow plenty of time to complete tasks.  Limit choices for the person.  Involve the person in what you are doing.  Keep a daily routine.  Avoid new or crowded places, if possible.  Use simple words, short sentences, and a calm voice. Only give one direction at a time.  Buy clothes and shoes that are easy to put on and take off.  Organize medicines in a pillbox for each day of the week.  Keep a calendar in a central location to remind the person of meetings or other activities.  Let people help if they offer. Take a break when needed. Tips to prevent injury  Keep floors clear. Remove rugs, magazine racks, and floor lamps.  Keep hallways well-lit.  Put a handrail and non-slip mat in the bathtub or shower.  Put childproof locks on cabinets that have dangerous items in them. These items include medicine, alcohol, guns, toxic cleaning items, sharp tools, matches, and lighters.  Put locks on doors where the person cannot see or reach them. This helps the  person to not wander out of the house and get lost.  Be prepared for emergencies. Keep a list of emergency phone numbers and addresses close by.  Bracelets may be worn that track location and identify the person as having memory problems. This should be worn at all times for safety. Tips for the future  Discuss financial and legal planning early. People with this disease have trouble managing their money as the disease gets worse. Get help from a professional.  Talk about advance directives, safety, and daily care. Take these steps: ? Create a living will and choose a power of attorney. This is someone who can make decisions for the person with Alzheimer disease when he or she can no longer do so. ? Discuss driving safety and when to stop driving. The person's doctor can help with this. ? If the person lives alone, make sure he or she is safe. Some people need extra help at home. Other people need more care at a nursing home or care center. Where to find support You can find support by joining a support group near you. Some benefits of joining a support group include:  Learning ways to manage stress.  Sharing experiences with others.  Getting emotional comfort and support.  Learning about caregiving as the disease progresses.  Knowing what community resources are available and making use of them. Where to find more information  [employer]: [employer] Contact a doctor if:  The person has a fever.  The person has a sudden behavior change that does not get better with calming strategies.  The person is not able to take care of himself or herself at home.  The person threatens you or anyone else, including himself or herself.  You are no longer able to care for the person. Summary  Alzheimer disease causes a person to forget things and to be confused.  A person who has this condition may not be able to take care of himself or herself.  Take steps to keep the  person from getting hurt. Plan for future care.  You can find support by joining a support group near you. This information is not intended to replace advice given to you by your health care provider. Make sure you discuss any questions you have with your health care provider. Document Revised: 06/22/2018 Document Reviewed: 02/26/2017 Elsevier Patient Education  2020 [employer].

## 2020-02-17 ENCOUNTER — Encounter: Payer: Self-pay | Admitting: Gastroenterology

## 2020-02-17 ENCOUNTER — Ambulatory Visit (AMBULATORY_SURGERY_CENTER): Payer: Self-pay | Admitting: *Deleted

## 2020-02-17 ENCOUNTER — Telehealth: Payer: Self-pay | Admitting: *Deleted

## 2020-02-17 DIAGNOSIS — Z1211 Encounter for screening for malignant neoplasm of colon: Secondary | ICD-10-CM

## 2020-02-17 NOTE — Telephone Encounter (Signed)
Virtual pre-visit for up-coming colonoscopy 02/22/2020

## 2020-02-24 ENCOUNTER — Other Ambulatory Visit: Payer: Self-pay

## 2020-02-24 ENCOUNTER — Ambulatory Visit (AMBULATORY_SURGERY_CENTER): Payer: BC Managed Care – PPO | Admitting: Gastroenterology

## 2020-02-24 ENCOUNTER — Encounter: Payer: Self-pay | Admitting: Gastroenterology

## 2020-02-24 VITALS — BP 133/83 | HR 63 | Temp 98.3°F | Resp 15 | Ht 65.0 in | Wt 174.0 lb

## 2020-02-24 DIAGNOSIS — Z1211 Encounter for screening for malignant neoplasm of colon: Secondary | ICD-10-CM

## 2020-02-24 DIAGNOSIS — D122 Benign neoplasm of ascending colon: Secondary | ICD-10-CM | POA: Diagnosis not present

## 2020-02-24 MED ORDER — SODIUM CHLORIDE 0.9 % IV SOLN: 500.0000 mL | Freq: Once | INTRAVENOUS | Status: DC

## 2020-02-24 NOTE — Op Note (Signed)
[facility] Patient Name: Danielle Krueger Procedure Date: 02/24/2020 3:08 PM MRN: [id number] Endoscopist: Mauri Pole , MD Age: 54 Referring MD:  Date of Birth: 21-Nov-1965 Gender: Female Account #: [id number] Procedure:                Colonoscopy Indications:              High risk colon cancer surveillance: Personal                            history of colonic polyps. Inadequate prep on prior                            colonoscopy. Medicines:                Monitored Anesthesia Care Procedure:                Pre-Anesthesia Assessment:                           - Prior to the procedure, a History and Physical                            was performed, and patient medications and                            allergies were reviewed. The patient's tolerance of                            previous anesthesia was also reviewed. The risks                            and benefits of the procedure and the sedation                            options and risks were discussed with the patient.                            All questions were answered, and informed consent                            was obtained. Prior Anticoagulants: The patient has                            taken no previous anticoagulant or antiplatelet                            agents. ASA Grade Assessment: II - A patient with                            mild systemic disease. After reviewing the risks                            and benefits, the patient was deemed in  satisfactory condition to undergo the procedure.                           After obtaining informed consent, the colonoscope                            was passed under direct vision. Throughout the                            procedure, the patient's blood pressure, pulse, and                            oxygen saturations were monitored continuously. The                            Colonoscope was introduced through the  anus and                            advanced to the the cecum, identified by                            appendiceal orifice and ileocecal valve. The                            colonoscopy was performed without difficulty. The                            patient tolerated the procedure well. The quality                            of the bowel preparation was good. The ileocecal                            valve, appendiceal orifice, and rectum were                            photographed. Scope In: 3:26:01 PM Scope Out: 3:49:14 PM Scope Withdrawal Time: 0 hours 13 minutes 47 seconds  Total Procedure Duration: 0 hours 23 minutes 13 seconds  Findings:                 The perianal and digital rectal examinations were                            normal.                           A less than 1 mm polyp was found in the ascending                            colon. The polyp was sessile. The polyp was removed                            with a cold biopsy forceps. Resection and retrieval  were complete.                           A 7 mm polyp was found in the ascending colon. The                            polyp was sessile. The polyp was removed with a                            cold snare. Resection and retrieval were complete.                           Non-bleeding internal hemorrhoids were found during                            retroflexion. The hemorrhoids were small. Complications:            No immediate complications. Estimated Blood Loss:     Estimated blood loss was minimal. Impression:               - One less than 1 mm polyp in the ascending colon,                            removed with a cold biopsy forceps. Resected and                            retrieved.                           - One 7 mm polyp in the ascending colon, removed                            with a cold snare. Resected and retrieved.                           - Non-bleeding internal  hemorrhoids. Recommendation:           - Patient has a contact number available for                            emergencies. The signs and symptoms of potential                            delayed complications were discussed with the                            patient. Return to normal activities tomorrow.                            Written discharge instructions were provided to the                            patient.                           - Resume previous  diet.                           - Continue present medications.                           - Await pathology results.                           - Repeat colonoscopy date to be determined after                            pending pathology results are reviewed for                            surveillance based on pathology results. Mauri Pole, MD 02/24/2020 4:02:24 PM This report has been signed electronically.

## 2020-02-24 NOTE — Patient Instructions (Signed)
Handouts given:  Polyps, Hemorrhoids Resume previous diet  continue current medications Await pathology results  YOU HAD AN ENDOSCOPIC PROCEDURE TODAY AT [facility]:   Refer to the procedure report that was given to you for any specific questions about what was found during the examination.  If the procedure report does not answer your questions, please call your gastroenterologist to clarify.  If you requested that your care partner not be given the details of your procedure findings, then the procedure report has been included in a sealed envelope for you to review at your convenience later.  YOU SHOULD EXPECT: Some feelings of bloating in the abdomen. Passage of more gas than usual.  Walking can help get rid of the air that was put into your GI tract during the procedure and reduce the bloating. If you had a lower endoscopy (such as a colonoscopy or flexible sigmoidoscopy) you may notice spotting of blood in your stool or on the toilet paper. If you underwent a bowel prep for your procedure, you may not have a normal bowel movement for a few days.  Please Note:  You might notice some irritation and congestion in your nose or some drainage.  This is from the oxygen used during your procedure.  There is no need for concern and it should clear up in a day or so.  SYMPTOMS TO REPORT IMMEDIATELY:   Following lower endoscopy (colonoscopy or flexible sigmoidoscopy):  Excessive amounts of blood in the stool  Significant tenderness or worsening of abdominal pains  Swelling of the abdomen that is new, acute  Fever of 100F or higher  For urgent or emergent issues, a gastroenterologist can be reached at any hour by calling [phone]. Do not use MyChart messaging for urgent concerns.   DIET:  We do recommend a small meal at first, but then you may proceed to your regular diet.  Drink plenty of fluids but you should avoid alcoholic beverages for 24 hours.  ACTIVITY:  You should  plan to take it easy for the rest of today and you should NOT DRIVE or use heavy machinery until tomorrow (because of the sedation medicines used during the test).    FOLLOW UP: Our staff will call the number listed on your records 48-72 hours following your procedure to check on you and address any questions or concerns that you may have regarding the information given to you following your procedure. If we do not reach you, we will leave a message.  We will attempt to reach you two times.  During this call, we will ask if you have developed any symptoms of COVID 19. If you develop any symptoms (ie: fever, flu-like symptoms, shortness of breath, cough etc.) before then, please call [phone].  If you test positive for Covid 19 in the 2 weeks post procedure, please call and report this information to [place].    If any biopsies were taken you will be contacted by phone or by letter within the next 1-3 weeks.  Please call us at [phone] if you have not heard about the biopsies in 3 weeks.   SIGNATURES/CONFIDENTIALITY: You and/or your care partner have signed paperwork which will be entered into your electronic medical record.  These signatures attest to the fact that that the information above on your After Visit Summary has been reviewed and is understood.  Full responsibility of the confidentiality of this discharge information lies with you and/or your care-partner.

## 2020-02-24 NOTE — Progress Notes (Signed)
VS- Danielle Krueger  Pt's states no medical or surgical changes since previsit or office visit.  

## 2020-02-24 NOTE — Progress Notes (Signed)
PT taken to PACU. Monitors in place. VSS. Report given to RN. 

## 2020-02-28 ENCOUNTER — Telehealth: Payer: Self-pay

## 2020-02-28 NOTE — Telephone Encounter (Signed)
  Follow up Call-  Call back number 02/24/2020 01/18/2020  Post procedure Call Back phone  # [phone]  Permission to leave phone message Yes Yes  Some recent data might be hidden     Patient questions:  Do you have a fever, pain , or abdominal swelling? No. Pain Score  0 *  Have you tolerated food without any problems? Yes.    Have you been able to return to your normal activities? Yes.    Do you have any questions about your discharge instructions: Diet   No. Medications  No. Follow up visit  No.  Do you have questions or concerns about your Care? No.  Actions: * If pain score is 4 or above: No action needed, pain <4. 1. Have you developed a fever since your procedure? no  2.   Have you had an respiratory symptoms (SOB or cough) since your procedure? no  3.   Have you tested positive for COVID 19 since your procedure no  4.   Have you had any family members/close contacts diagnosed with the COVID 19 since your procedure?  no   If yes to any of these questions please route to Joylene John, RN and Joella Prince, RN

## 2020-03-12 ENCOUNTER — Encounter: Payer: Self-pay | Admitting: Gastroenterology

## 2021-01-25 ENCOUNTER — Other Ambulatory Visit: Payer: Self-pay | Admitting: Obstetrics and Gynecology

## 2021-01-25 DIAGNOSIS — Z1231 Encounter for screening mammogram for malignant neoplasm of breast: Secondary | ICD-10-CM

## 2021-02-27 ENCOUNTER — Ambulatory Visit
Admission: RE | Admit: 2021-02-27 | Discharge: 2021-02-27 | Disposition: A | Discharge status: Home / Self Care | Payer: BC Managed Care – PPO | Source: Ambulatory Visit | Attending: Obstetrics and Gynecology | Admitting: Obstetrics and Gynecology

## 2021-02-27 DIAGNOSIS — Z1231 Encounter for screening mammogram for malignant neoplasm of breast: Secondary | ICD-10-CM

## 2021-10-08 ENCOUNTER — Telehealth: Payer: Self-pay | Admitting: Neurology

## 2021-10-08 NOTE — Telephone Encounter (Signed)
Pt scheduled pt with Janett Billow, NP on 10/10/21 due to having a hard time sleeping. Pt feels BIPAP pressure needs to be increased.

## 2021-10-08 NOTE — Telephone Encounter (Signed)
noted 

## 2021-10-10 ENCOUNTER — Ambulatory Visit: Payer: BC Managed Care – PPO | Admitting: Adult Health

## 2021-11-04 ENCOUNTER — Encounter: Payer: Self-pay | Admitting: Adult Health

## 2021-11-04 ENCOUNTER — Ambulatory Visit: Payer: BC Managed Care – PPO | Admitting: Adult Health

## 2021-11-04 VITALS — BP 103/65 | HR 76 | Ht 66.0 in | Wt 192.1 lb

## 2021-11-04 DIAGNOSIS — Z9989 Dependence on other enabling machines and devices: Secondary | ICD-10-CM | POA: Diagnosis not present

## 2021-11-04 DIAGNOSIS — G4731 Primary central sleep apnea: Secondary | ICD-10-CM

## 2021-11-04 NOTE — Progress Notes (Signed)
[name] [address] [place]. [place] [address] [phone]       OFFICE FOLLOW UP NOTE  [name] Date of Birth:  Dec 09, 1965 Medical Record Number:  [id number]    Primary neurologist: Dr. Brett Fairy Reason for visit: BiPAP follow-up    SUBJECTIVE:   CHIEF COMPLAINT:  Chief Complaint  Patient presents with   bipap    Pt is well. She states that she has been doing good on BIPAP. She reports that wishes that it was stronger pressure. She also states that its taking her longer to fall asleep. Room  alone     HPI:   Update 11/04/2021 JM: Patient returns for BiPAP f/u visit and concerns that pressure needs to be adjusted.  She was previously seen by Dr. Brett Fairy 02/2020 doing well at that time on same settings. Reports about 8-12 months ago, starting to feel as though she needed more air breathing in and more assistance with breathing out. She has also been having greater difficulty initiating sleep, she does have significant home stressors with her husband currently in stage 6 of early onset Alzheimer's dementia, she is his full-time caregiver as well as working full-time, PCP recently switched her from lorazepam to clonazepam, she will take occasionally during the day but if fearful to take at night as she wants to make sure she can wake up if her husband needs her.  He is otherwise tolerating BiPAP well and tolerating full facemask without any concerns.  Epworth Sleepiness Scale 4/24 Fatigue severity scale 44/63            History from Dr. Edwena Felty prior [place] note provided for reference purposes only Interval history for this established CSA and Tourette's syndrome patient on 02-16-2020: I have the pleasure to see Danielle Krueger. Danielle Krueger, today on 16 February 2020, she is an established and compliant patient and has used her BiPAP at 18/12 cmH2O with an ST rate of 12/min on 27 of the last 30 days there is a 90% compliance average user time 6  hours 41 minutes, AHI is 3.8 which is excellent, she does have mild air leakage no reason for concern.  She endorsed the fatigue severity score 33 and the Epworth Sleepiness Scale at only 1 point.   There has been a significant stressor in Danielle Krueger's life as her husband Danielle Krueger was diagnosed in 2020 with early onset Alzheimer's at age 53. He is now attending an adult memory care in daytime twice a week, her 2 year old mother helps with driving and she is full-time gainfully employed.  This is Danielle Krueger second marriage and he has 4 adult children from his first marriage , but the contact has been very sparse and almost contentious at this time. She feels very much alone in the caregiver role.  She is working for an [employer], [employer], she is an Optometrist. Some days of the week she can work from home.   Her husband is under care with Dr. Leonarda Sal[facility] at [facility]. Her father died of dementia and her brother died of a GBM.        Interval history from 10-13-2018. CD I have the pleasure to see Danielle Krueger. 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  This is a face-to-face visit dated 13 October 2018.  First I would like to summarize the patient's sleep study the patient has a history of loud snoring, Tourette's syndrome and hypertension she  had a PSG performed on 09 Aug 2018 which resulted in an AHI of 22.6/h during REM sleep exacerbated to 66.3/h there was also supine sleep position accentuation to an AHI of 39.3.  There were some central events noted which makes we will did describe in detail later.  Her total time this low oxygen desaturation was 19 minutes the nadir SPO2 was 81% oxygen saturation.   The patient returned on 21 July for an attended CPAP titration which was initiated at 5 cm water and step-by-step increased to 15 there was still an AHI of 3.5/h noted the technician changed to BiPAP modality beginning  at 16/12 cmH2O but the patient could not initiate sleep on BiPAP at first and ST a reminder breath setting was initiated and she finally arrived at 18/13 cmH2O BiPAP with ST of 12/min and the AHI was now reduced to 2.4.  At the time of her titration the patient had actually already tried CPAP at home with an auto titration machine she used the machine 97% of the time for the last days of last night.   Average user time is 7 hours and 47 minutes the O2 sat allowed a pressure window from 5 cm minimum pressure to 13 cm maximum pressure, with 3 cm water pressure of expiratory pressure relief.  The residual AHI is high at 9.1 but the patient spent 95th percentile pressure at 13 the maximum setting.  There were 3 central apneas per 4.7 obstructive apneas noted and she also gained 7 minutes of Cheyne-Stokes respirations.  She felt great ! awake and energetic.    Given the results of her titration I would think that she tolerate BiPAP better and will have much less central apnea.  For this reason we will change her CPAP to a BiPAP with a respiratory capture of 12/min.      ROS:   14 system review of systems performed and negative with exception of those listed in HPI  PMH:  Past Medical History:  Diagnosis Date   Acne    Allergy    mild    Asthma    no issues in 20 years   Broken collarbone    Chest pain    Eval ER 5/24 R/O normal ECG negative markers, ? anxiety   Chronic bronchitis (HCC)    Complication of anesthesia    age 23ys, "could not breathe after anes started"    DDD (degenerative disc disease), lumbar    Depressed    Dislocated shoulder    right    GAD (generalized anxiety disorder)    GERD (gastroesophageal reflux disease)    remote history    Headache    stress hedaches   Heart murmur    has closed   Hyperlipidemia    Neuromuscular disorder (Danielle Krueger)    Tourette's syndrome- mild tics, no verbal issues    OSA treated with BiPAP    Pneumonia    remote history    PONV  (postoperative nausea and vomiting)    Rosacea    Sleep apnea    wears BIPAP q night    Tourette's    eye blinking    PSH:  Past Surgical History:  Procedure Laterality Date   ABDOMINAL HYSTERECTOMY  2016   [facility] REPAIR SHOULDER Right    EXCISION OF SKIN TAG N/A 08/26/2019   Procedure: EXCISION OF ANAL SKIN TAG;  Surgeon: Leighton Ruff, MD;  Location: [facility];  Service: General;  Laterality: N/A;   FLEXIBLE  SIGMOIDOSCOPY N/A 11/25/2019   Procedure: FLEXIBLE SIGMOIDOSCOPY;  Surgeon: Leighton Ruff, MD;  Location: [facility];  Service: Endoscopy;  Laterality: N/A;  IV SEDATION BY SURGEON   KNEE ARTHROSCOPY Left    x2   laser of vulva     MOUTH SURGERY     POLYPECTOMY  11/25/2019   Procedure: POLYPECTOMY;  Surgeon: Leighton Ruff, MD;  Location: [facility];  Service: Endoscopy;;   POLYPECTOMY  11/25/2019   TA x 2      RHINOPLASTY     age 20yr   SIGMOIDOSCOPY  11/25/2019   incomplete colon , did flex , 2 polyps-  rescheduled colon with MAC    uterine ablation      Social History:  Social History   Socioeconomic History   Marital status: Married    Spouse name: Not on file   Number of children: Not on file   Years of education: Not on file   Highest education level: Not on file  Occupational History   Not on file  Tobacco Use   Smoking status: Former    Packs/day: 1.00    Years: 15.00    Total pack years: 15.00    Types: Cigarettes    Quit date: 2011    Years since quitting: 12.6   Smokeless tobacco: Never  Vaping Use   Vaping Use: Never used  Substance and Sexual Activity   Alcohol use: Yes    Comment: Very rare   Drug use: No   Sexual activity: Not on file  Other Topics Concern   Not on file  Social History Narrative   6 children combined remarriage. 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 Wants to work out at [employer]    Social Determinants of Health   Financial Resource Strain: Not on file  Food Insecurity: Not on file  Transportation  Needs: Not on file  Physical Activity: Not on file  Stress: Not on file  Social Connections: Not on file  Intimate Partner Violence: Not on file    Family History:  Family History  Problem Relation Age of Onset   Breast cancer Mother    Heart attack Mother    Sleep apnea Mother    Sleep apnea Father    Heart disease Father    Brain cancer Brother    Colon polyps Maternal Grandmother    Esophageal cancer Other    Coronary artery disease Neg Hx        premature   Colon cancer Neg Hx    Rectal cancer Neg Hx    Stomach cancer Neg Hx     Medications:   Current Outpatient Medications on File Prior to Visit  Medication Sig Dispense Refill   busPIRone (BUSPAR) 10 MG tablet Take 10 mg by mouth 2 (two) times daily.     clonazePAM (KLONOPIN) 0.5 MG tablet Take 0.5 mg by mouth 2 (two) times daily as needed for anxiety.     cloNIDine (CATAPRES) 0.1 MG tablet Take 0.1 mg by mouth 3 (three) times daily.     desvenlafaxine (PRISTIQ) 100 MG 24 hr tablet Take 200 mg by mouth daily.      progesterone (PROMETRIUM) 100 MG capsule Take 100 mg by mouth at bedtime.     rosuvastatin (CRESTOR) 10 MG tablet Take 10 mg by mouth every evening.      ampicillin (PRINCIPEN) 500 MG capsule Take 500 mg by mouth 2 (two) times daily. (Patient not taking: Reported on 11/04/2021)     aspirin-acetaminophen-caffeine (EXCEDRIN MIGRAINE)  250-250-65 MG tablet Take 2 tablets by mouth every 6 (six) hours as needed for headache. (Patient not taking: Reported on 11/04/2021)     baclofen (LIORESAL) 20 MG tablet Take 20 mg by mouth 3 (three) times daily.  (Patient not taking: Reported on 11/04/2021)  0   LORazepam (ATIVAN) 0.5 MG tablet Take 0.5 mg by mouth 3 (three) times daily as needed for anxiety. sleep (Patient not taking: Reported on 11/04/2021)     No current facility-administered medications on file prior to visit.    Allergies:   Allergies  Allergen Reactions   Metronidazole Rash    Topical Metro gel       OBJECTIVE:  Physical Exam  Vitals:   11/04/21 1537  BP: 103/65  Pulse: 76  Weight: 192 lb 2 oz (87.1 kg)  Height: _0  (1.676 m)   Body mass index is 31.01 kg/m. No results found.   General: well developed, well nourished, very pleasant middle-age Caucasian female, seated, in no evident distress Head: head normocephalic and atraumatic.   Neck: supple with no carotid or supraclavicular bruits Cardiovascular: regular rate and rhythm, no murmurs Musculoskeletal: no deformity Skin:  no rash/petichiae Vascular:  Normal pulses all extremities   Neurologic Exam Mental Status: Awake and fully alert. Oriented to place and time. Recent and remote memory intact. Attention span, concentration and fund of knowledge appropriate. Mood and affect appropriate.  Cranial Nerves: Pupils equal, briskly reactive to light. Extraocular movements full without nystagmus. Visual fields full to confrontation. Hearing intact. Facial sensation intact. Face, tongue, palate moves normally and symmetrically.  Motor: Normal bulk and tone. Normal strength in all tested extremity muscles Sensory.: intact to touch , pinprick , position and vibratory sensation.  Coordination: Rapid alternating movements normal in all extremities. Finger-to-nose and heel-to-shin performed accurately bilaterally. Gait and Station: Arises from chair without difficulty. Stance is normal. Gait demonstrates normal stride length and balance without use of AD. Tandem walk and heel toe without difficulty.  Reflexes: 1+ and symmetric. Toes downgoing.         ASSESSMENT/PLAN: VERNIS CABACUNGAN is a 56 y.o. year old female with longstanding history of sleep apnea on BiPAP.      Sleep apnea on BiPAP : compliance shows excellent compliance and optimal residual AHI. She is concerned regarding not getting enough pressure and difficulty sleeping. Will increase EPAP from 12 to 13 but will maintain IPAP at 18. Advised to call after  a couple months if she continues to have difficulty, will need to have f/u with Dr. Brett Fairy if so. Discussed use of clonazepam at night to help her relax as this could be contributing, clonazepam currently prescribed by PCP.  Order will be placed to [place]    Follow up in 1 year or call earlier if needed   CC:  PCP: Chesley Noon, MD    I spent 22 minutes of face-to-face and non-face-to-face time with patient.  This included previsit chart review, lab review, study review, order entry, electronic health record documentation, patient education sleep apnea with review and discussion of compliance report and answered all other questions to patient's satisfaction   Frann Rider, [facility]  [facility] [address] [place], [place] [address]  Phone [phone] Fax [phone] Note: This document was prepared with digital dictation and possible smart phrase technology. Any transcriptional errors that result from this process are unintentional.

## 2021-11-28 ENCOUNTER — Encounter: Payer: Self-pay | Admitting: Adult Health

## 2021-12-04 NOTE — Progress Notes (Signed)
-----   Message -----  From: Trinidad Curet, CMA  Sent: 11/04/2021   4:09 PM EDT  To: Orrin Brigham Hepler  Subject: CPAP order                                     New orders have been placed for the above pt, DOB: [birth year]  Thanks

## 2021-12-20 ENCOUNTER — Telehealth: Payer: Self-pay | Admitting: Adult Health

## 2021-12-20 NOTE — Telephone Encounter (Signed)
Pt states her BiPap is dying out, the motor is very loud, there is no pressure when trying to breathe.  Pt states her husband has Alzheimer's.  Sleep is very important for them both and she is unable to sleep with condition of her BiPap, pt would like to discuss a replacement.

## 2021-12-22 IMAGING — MG MM DIGITAL SCREENING BILAT W/ TOMO AND CAD
8 series · 9 of 24 positions shown · non-contrast
Comparison: Previous exam(s).

CLINICAL DATA: Screening.

EXAM:
DIGITAL SCREENING BILATERAL MAMMOGRAM WITH TOMOSYNTHESIS AND CAD
TECHNIQUE: Bilateral screening digital craniocaudal and mediolateral oblique
mammograms were obtained. Bilateral screening digital breast
tomosynthesis was performed. The images were evaluated with
computer-aided detection.

[L CC synth-2D]
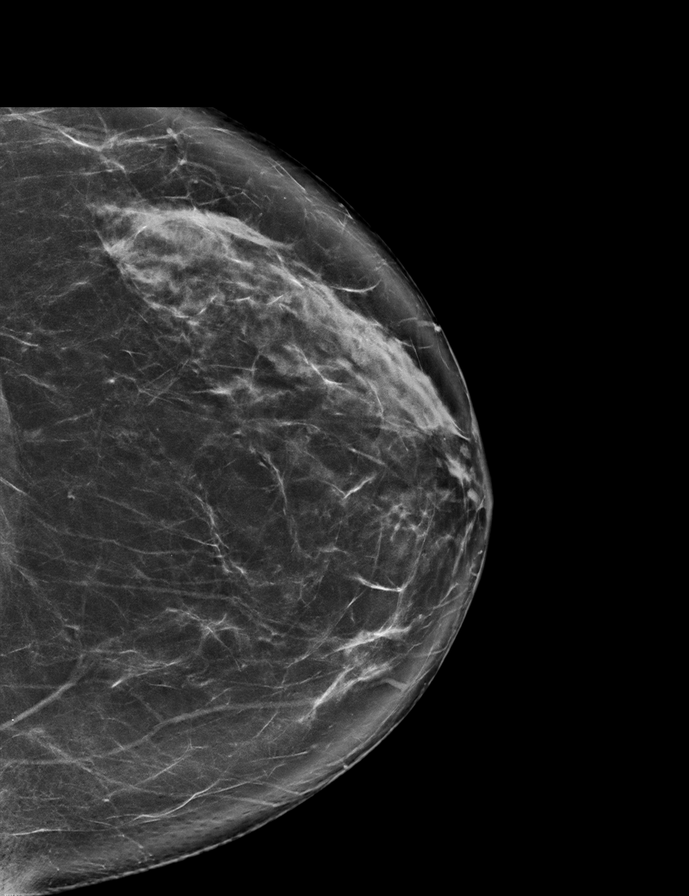

[R MLO synth-2D]
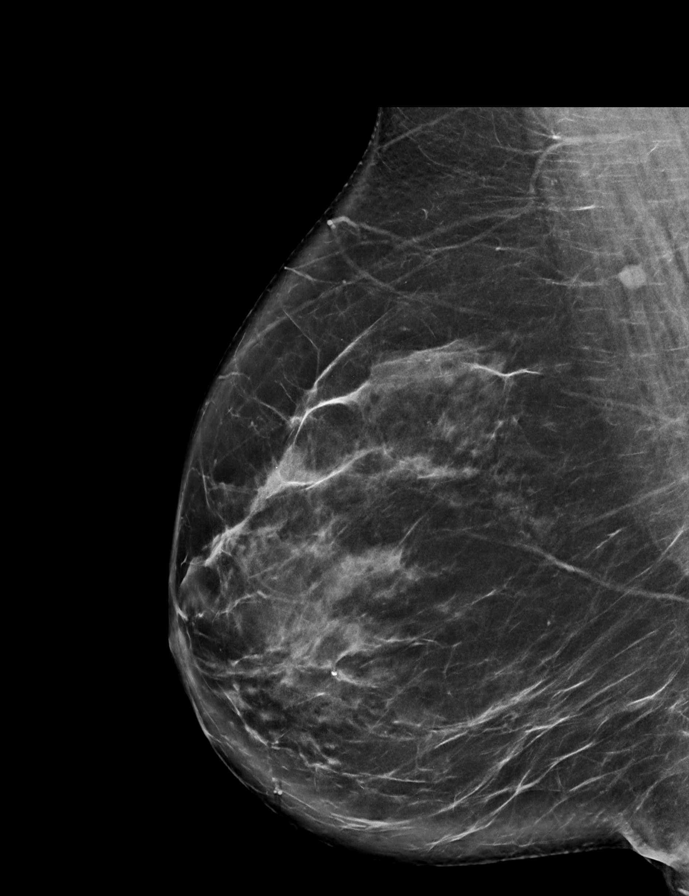

[R CC synth-2D]
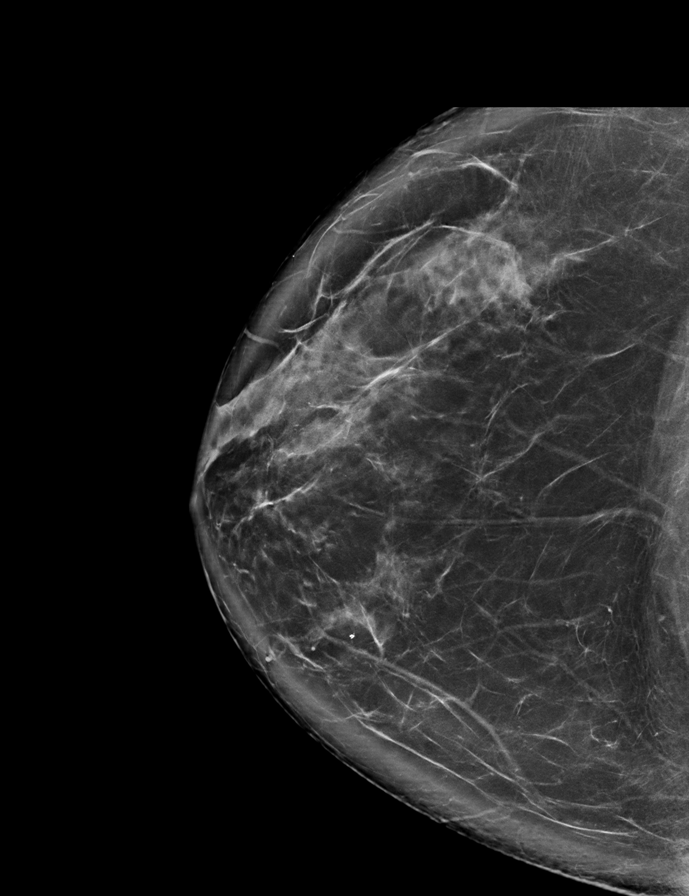

[L MLO synth-2D]
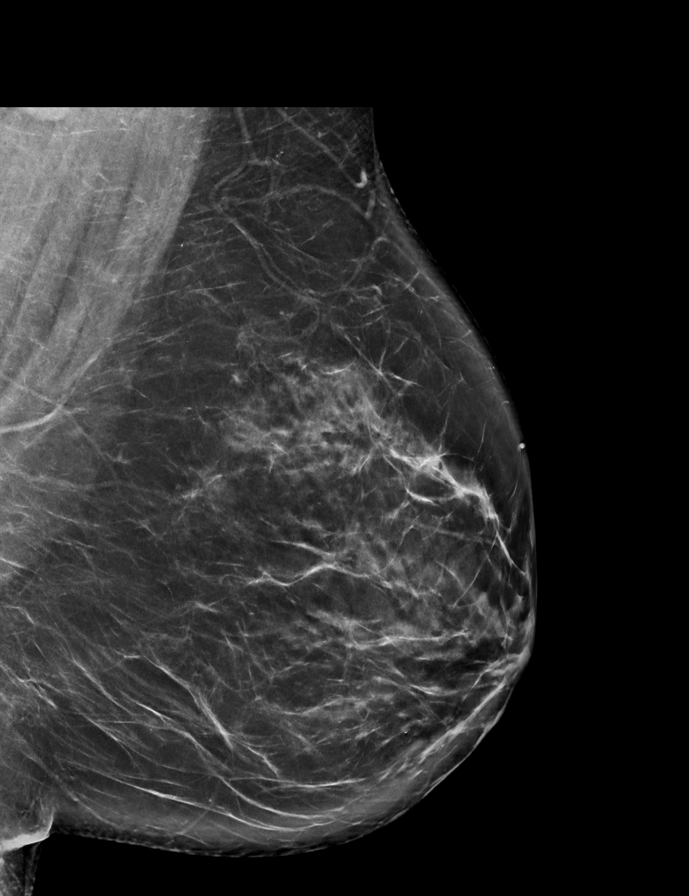

[L MLO tomo · 2 of 91 frames shown]
[frame 30/91]
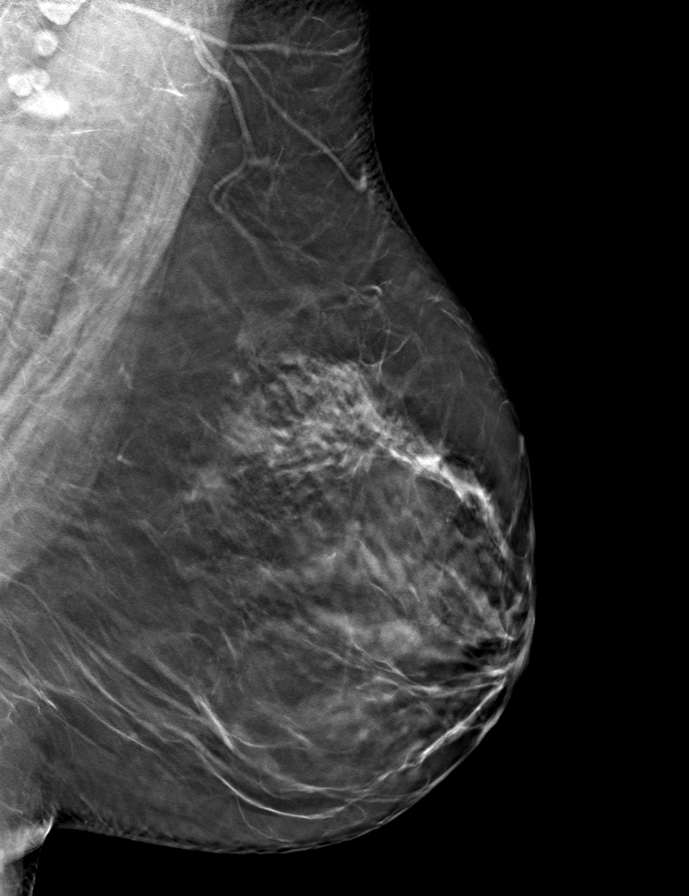
[frame 46/91]
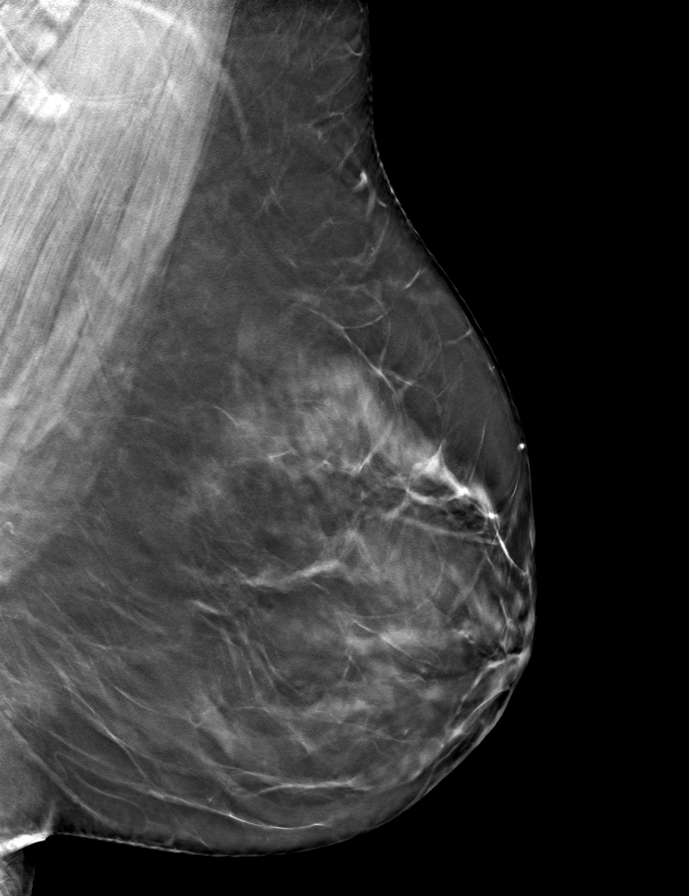

[R CC tomo · tomo slice 49/96.0]
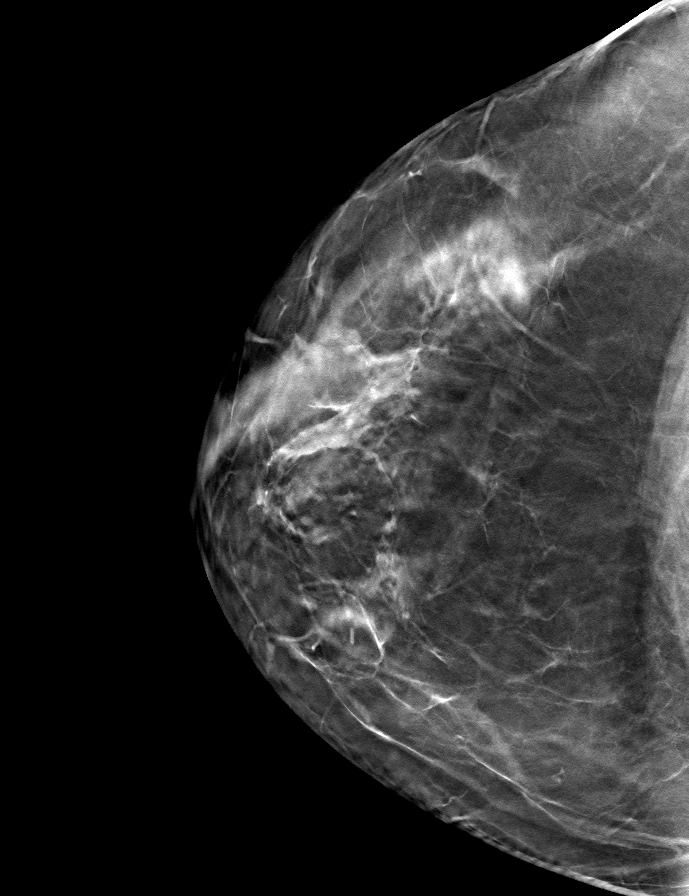

[L CC tomo · tomo slice 43/85.0]
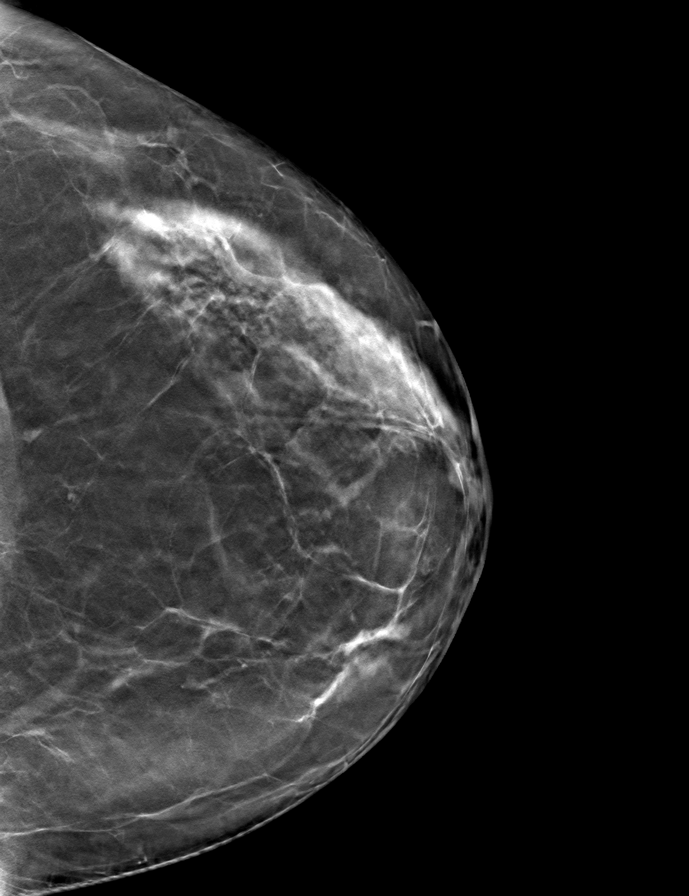

[R MLO tomo · tomo slice 47/92.0]
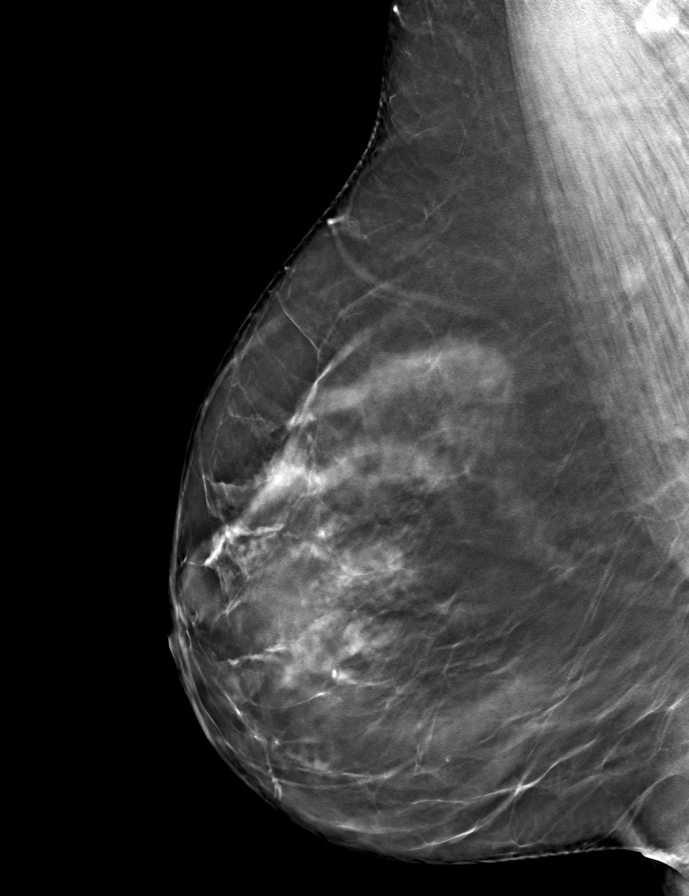

[9 of 24 positions shown; findings below may reference images not displayed]

ACR Breast Density Category c: The breast tissue is heterogeneously
dense, which may obscure small masses.
FINDINGS: There are no findings suspicious for malignancy.
IMPRESSION: No mammographic evidence of malignancy. A result letter of this
screening mammogram will be mailed directly to the patient.

RECOMMENDATION:
Screening mammogram in one year. (Code:Q3-W-BC3)

BI-RADS CATEGORY  1: Negative.

## 2021-12-23 NOTE — Telephone Encounter (Signed)
Contacted pt due to not quite understanding her message. She thought I was with her DME, informed her I am at [facility], apologized for the confusion but assured her [facility] would be the one to replace.help with bipap. She verbally understood and was appreciative.

## 2023-06-17 ENCOUNTER — Other Ambulatory Visit: Payer: Self-pay | Admitting: Obstetrics and Gynecology

## 2023-06-17 DIAGNOSIS — Z1231 Encounter for screening mammogram for malignant neoplasm of breast: Secondary | ICD-10-CM

## 2023-07-03 ENCOUNTER — Ambulatory Visit
Admission: RE | Admit: 2023-07-03 | Discharge: 2023-07-03 | Disposition: A | Discharge status: Home / Self Care | Source: Ambulatory Visit | Attending: Obstetrics and Gynecology | Admitting: Obstetrics and Gynecology

## 2023-07-03 DIAGNOSIS — Z1231 Encounter for screening mammogram for malignant neoplasm of breast: Secondary | ICD-10-CM

## 2023-09-24 ENCOUNTER — Telehealth: Payer: Self-pay | Admitting: Adult Health

## 2023-09-24 NOTE — Telephone Encounter (Signed)
 Patient called due to received message earlier appointment available from the waitlist. Reschedule appointment

## 2023-10-05 ENCOUNTER — Ambulatory Visit: Admitting: Neurology

## 2023-10-06 ENCOUNTER — Encounter: Payer: Self-pay | Admitting: Neurology

## 2023-10-06 ENCOUNTER — Ambulatory Visit (INDEPENDENT_AMBULATORY_CARE_PROVIDER_SITE_OTHER): Admitting: Neurology

## 2023-10-06 VITALS — BP 112/69 | HR 78 | Ht 65.0 in | Wt 187.0 lb

## 2023-10-06 DIAGNOSIS — G4731 Primary central sleep apnea: Secondary | ICD-10-CM | POA: Diagnosis not present

## 2023-10-06 DIAGNOSIS — Z9989 Dependence on other enabling machines and devices: Secondary | ICD-10-CM | POA: Diagnosis not present

## 2023-10-06 DIAGNOSIS — F514 Sleep terrors [night terrors]: Secondary | ICD-10-CM | POA: Diagnosis not present

## 2023-10-06 DIAGNOSIS — F952 Tourette's disorder: Secondary | ICD-10-CM | POA: Diagnosis not present

## 2023-10-06 NOTE — Patient Instructions (Signed)
 PLAN : lets reorder BIPAP machine for her. I would like to not have to try to re qualify her for biPAP.   She would like a cm more inspiratory pressure- I will order 19/ 13 cm water , ST 12.  She likes  her FFM with magnetic clips FFM I 30.

## 2023-10-06 NOTE — Progress Notes (Signed)
 Provider:  Dedra Gores, MD  Primary Care Physician:  Sophronia Ozell BROCKS, MD [address] [place] [address]     Referring Provider: Sophronia Ozell BROCKS, Md [address] [place],  [place] [address]          Chief Complaint according to patient   Patient presents with:          Widow of [place]. Here for sleep .      HISTORY OF PRESENT ILLNESS:  Danielle Krueger is a 58 y.o. female patient who is here for revisit 10/06/2023 for  night terrors, but these have been controlled. 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   [name] has been med compliant use of her BiPAP machine which has a BiPAP ST with a set respiratory rate of 12/min.  She is using 18 over 13 cm water pressure and achieved an AHI residual of 1.7/h.  She has a air leak of about 18 L a minute which is moderate.  She achieves about 6-1/2 hours of sleep each night.  She is not excessively daytime sleepy nor fatigued.   She is here to reestablish her sleep care and she reports that her parasomnias are still very well-controlled especially that she does not have the sleep choking events anymore.  She has started on Nebivolol 2.5 mg in the morning she has been placed on fentanyl  9 for weight control but also as a alertness medication at 37 point 5 in the morning she is still on Pristiq, on Catapres  at night, Klonopin as needed, and Crestor.  She also uses Prometrium 100 mg at night which does help with sleep.  She had a hysterectomy.   With phentermine came a bit more energy but also a delayed sleep onset to an hour.   She has also been on her phone in bed.      10-13-2018. 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  This is a face-to-face visit dated 13 October 2018.  First I would like to summarize the patient's sleep study the patient has a history of loud snoring, Tourette's syndrome and hypertension she had a PSG performed on 09 Aug 2018 which resulted in an AHI of 22.6/h during REM sleep exacerbated to 66.3/h there was also supine sleep position accentuation to an AHI of 39.3.  There were some central events noted which makes we will did describe in detail later.  Her total time this low oxygen desaturation was 19 minutes the nadir SPO2 was 81% oxygen saturation.   The patient returned on 21 July for an attended CPAP titration which was initiated at 5 cm water and step-by-step increased to 15 there was still an AHI of 3.5/h noted the technician changed to BiPAP modality beginning at 16/12 cmH2O but the patient could not initiate sleep on BiPAP at first and ST a reminder breath setting was initiated and she finally arrived at 18/13 cmH2O BiPAP with ST of 12/min and the AHI was now reduced to 2.4.  At the time of her titration the patient had actually already tried CPAP at home with an auto titration machine  she used the machine 97% of the time for the last days of last night.   Average user time is 7 hours and 47 minutes the O2 sat allowed a pressure window from 5 cm minimum pressure to 13 cm maximum pressure, with 3 cm water pressure of expiratory pressure relief.  The residual AHI is high at 9.1 but the patient spent 95th percentile pressure at 13 the maximum setting.  There were 3 central apneas per 4.7 obstructive apneas noted and she also gained 7 minutes of Cheyne-Stokes respirations.  She felt great ! awake and energetic.    Given the results of her titration I would think that she tolerate BiPAP better and will have much less central apnea.  For this reason we will change her CPAP to a BiPAP with a respiratory capture of 12/min.         Review of  Systems: Out of a complete 14 system review, the patient complains of only the following symptoms, and all other reviewed systems are negative.:   SLEEPINESS ?  How likely are you to doze in the following situations: 0 = not likely, 1 = slight chance, 2 = moderate chance, 3 = high chance  Sitting and Reading? Watching Television? Sitting inactive in a public place (theater or meeting)? Lying down in the afternoon when circumstances permit? Sitting and talking to someone? Sitting quietly after lunch without alcohol? In a car, while stopped for a few minutes in traffic? As a passenger in a car for an hour without a break?  Total = 1/24        Social History   Socioeconomic History   Marital status: Married    Spouse name: Not on file   Number of children: Not on file   Years of education: Not on file   Highest education level: Not on file  Occupational History   Not on file  Tobacco Use   Smoking status: Former    Current packs/day: 0.00    Average packs/day: 1 pack/day for 15.0 years (15.0 ttl pk-yrs)    Types: Cigarettes    Start date: 27    Quit date: 2011    Years since quitting: 14.5   Smokeless tobacco: Never  Vaping Use   Vaping status: Never Used  Substance and Sexual Activity   Alcohol use: Yes    Comment: Very rare   Drug use: No   Sexual activity: Not on file  Other Topics Concern   Not on file  Social History Narrative   6 children combined remarriage. Works for Pension scheme manager. Wants to work out at [employer].    Social Drivers of Corporate investment banker Strain: Low Risk  (05/15/2023)   Received from [facility]   Overall Financial Resource Strain (CARDIA)    Difficulty of Paying Living Expenses: Not hard at all  Food Insecurity: No Food Insecurity (05/15/2023)   Received from [facility]   Hunger Vital Sign    Within the past 12 months, you worried that your food would run out before you got the money to buy more.: Never true     Within the past 12 months, the food you bought just didn't last and you didn't have money to get more.: Never true  Transportation Needs: No Transportation Needs (05/15/2023)   Received from [facility] - Transportation    Lack of Transportation (Medical): No    Lack of Transportation (Non-Medical): No  Physical Activity: Insufficiently Active (05/15/2023)  Received from [facility]   Exercise Vital Sign    On average, how many days per week do you engage in moderate to strenuous exercise (like a brisk walk)?: 1 day    On average, how many minutes do you engage in exercise at this level?: 30 min  Stress: No Stress Concern Present (05/15/2023)   Received from [facility] - Occupational Stress Questionnaire    Feeling of Stress : Only a little  Social Connections: Socially Integrated (05/15/2023)   Received from [facility]   Social Network    How would you rate your social network (family, work, friends)?: Good participation with social networks    Family History  Problem Relation Age of Onset   Breast cancer Mother    Heart attack Mother    Sleep apnea Mother    Sleep apnea Father    Heart disease Father    Brain cancer Brother    Colon polyps Maternal Grandmother    Esophageal cancer Other    Coronary artery disease Neg Hx        premature   Colon cancer Neg Hx    Rectal cancer Neg Hx    Stomach cancer Neg Hx     Past Medical History:  Diagnosis Date   Acne    Allergy    mild    Asthma    no issues in 20 years   Broken collarbone    Chest pain    Eval ER 5/24 R/O normal ECG negative markers, ? anxiety   Chronic bronchitis (HCC)    Complication of anesthesia    age 72ys, could not breathe after anes started    DDD (degenerative disc disease), lumbar    Depressed    Dislocated shoulder    right    GAD (generalized anxiety disorder)    GERD (gastroesophageal reflux disease)    remote history     Headache    stress hedaches   Heart murmur    has closed   Hyperlipidemia    Neuromuscular disorder (HCC)    Tourette's syndrome- mild tics, no verbal issues    OSA treated with BiPAP    Pneumonia    remote history    PONV (postoperative nausea and vomiting)    Rosacea    Sleep apnea    wears BIPAP q night    Tourette's    eye blinking    Past Surgical History:  Procedure Laterality Date   ABDOMINAL HYSTERECTOMY  2016   [facility] REPAIR SHOULDER Right    EXCISION OF SKIN TAG N/A 08/26/2019   Procedure: EXCISION OF ANAL SKIN TAG;  Surgeon: Debby Hila, MD;  Location: [facility];  Service: General;  Laterality: N/A;   FLEXIBLE SIGMOIDOSCOPY N/A 11/25/2019   Procedure: FLEXIBLE SIGMOIDOSCOPY;  Surgeon: Debby Hila, MD;  Location: [facility];  Service: Endoscopy;  Laterality: N/A;  IV SEDATION BY SURGEON   KNEE ARTHROSCOPY Left    x2   laser of vulva     MOUTH SURGERY     POLYPECTOMY  11/25/2019   Procedure: POLYPECTOMY;  Surgeon: Debby Hila, MD;  Location: [facility];  Service: Endoscopy;;   POLYPECTOMY  11/25/2019   TA x 2      RHINOPLASTY     age 26yrs   SIGMOIDOSCOPY  11/25/2019   incomplete colon , did flex , 2 polyps-  rescheduled colon with MAC    uterine ablation  Current Outpatient Medications on File Prior to Visit  Medication Sig Dispense Refill   clonazePAM (KLONOPIN) 0.5 MG tablet Take 0.5 mg by mouth 2 (two) times daily as needed for anxiety.     cloNIDine  (CATAPRES ) 0.1 MG tablet Take 0.1 mg by mouth 3 (three) times daily.     desvenlafaxine (PRISTIQ) 100 MG 24 hr tablet Take 200 mg by mouth daily.      nebivolol (BYSTOLIC) 2.5 MG tablet Take 2.5 mg by mouth daily.     phentermine (ADIPEX-P) 37.5 MG tablet Take 37.5 mg by mouth daily.     progesterone (PROMETRIUM) 100 MG capsule Take 100 mg by mouth at bedtime.     rosuvastatin (CRESTOR) 10 MG tablet Take 10 mg by mouth every evening.      No current facility-administered  medications on file prior to visit.    Allergies  Allergen Reactions   Metronidazole Rash    Topical Metro gel     DIAGNOSTIC DATA (LABS, IMAGING, TESTING) - I reviewed patient records, labs, notes, testing and imaging myself where available.  Lab Results  Component Value Date   WBC 9.0 12/21/2014   HGB 13.3 08/26/2019   HCT 39.0 08/26/2019   MCV 91.5 12/21/2014   PLT 437 (H) 12/21/2014      Component Value Date/Time   NA 141 08/26/2019 0813   K 4.1 08/26/2019 0813   CL 102 08/26/2019 0813   CO2 24 12/21/2014 1522   GLUCOSE 100 (H) 08/26/2019 0813   BUN 18 08/26/2019 0813   CREATININE 0.90 08/26/2019 0813   CALCIUM 9.3 12/21/2014 1522   PROT 7.6 12/21/2014 1522   ALBUMIN 4.4 12/21/2014 1522   AST 19 12/21/2014 1522   ALT 17 12/21/2014 1522   ALKPHOS 77 12/21/2014 1522   BILITOT 0.5 12/21/2014 1522   GFRNONAA >60 12/21/2014 1522   GFRAA >60 12/21/2014 1522   No results found for: CHOL, HDL, LDLCALC, LDLDIRECT, TRIG, CHOLHDL No results found for: YHAJ8R No results found for: VITAMINB12 No results found for: TSH  PHYSICAL EXAM:  Vitals:   10/06/23 1056  BP: 112/69  Pulse: 78   No data found. Body mass index is 31.12 kg/m.   Wt Readings from Last 3 Encounters:  10/06/23 187 lb (84.8 kg)  11/04/21 192 lb 2 oz (87.1 kg)  02/24/20 174 lb (78.9 kg)     Ht Readings from Last 3 Encounters:  10/06/23 5' 5 (1.651 m)  11/04/21 5' 6 (1.676 m)  02/24/20 5' 5 (1.651 m)      General: The patient is awake, alert and appears not in acute distress and groomed. Head: Normocephalic, atraumatic.  Neck is supple. General: The patient is awake, alert and appears not in acute distress. The patient is well groomed. Head: Normocephalic, atraumatic. Neck is supple. Mallampati 1   ,  neck circumference:15.75 . Nasal airflow is unrestricted ,  she reports TMJ click . Retrognathia is not seen.  Respiratory: no evidence of SOB. Skin:   no evidence of  edema, or rash Trunk: BMI is 31 and was 27 at the time of last sleep study .    OBSERVATION:  The patient is awake and alert, oriented to place and time.    Attention span & concentration ability appears normal.  Speech is fluent, without dysarthria, dysphonia , she speaks clearly but slowly- some word finding may be delayed / aphasia.  Mood and affect are appropriate. Cranial nerves: Pupils are equal . Facial motor strength is symmetric  and tongue and uvula move midline. Shoulder shrug was symmetrical.  I did not witness a facial tic during this video guided conversation  Motor exam:   normal muscle bulk and symmetric ROM in all extremities.  Coordination: Movements in the fingers/hands was normal.  Gait and station: Patient walks without assistive device. Toe and heel walk were deferred.  Deep tendon reflexes: in the  upper and lower extremities are symmetric and intact.  Babinski response was deferred.      ASSESSMENT AND PLAN :   58 y.o. year old female  here with:    1) Apnea on biPAP Had central apneas on auto CPAP, and a high AHI of 9.1/h. attended sleep study revealed central and complex sleep apnea, and she tolerated BiPAP 18/ 12 cm water pressure ST 12 , and she has been highly compliant.  Machine is now 58 years old, it was a  replacement through [employer].    2) Night terrors well controlled on  minipress, clonazepam and pristiq   3)phentermine has restarted  the  delayed sleep onset, prolonged latency, she has worked on her sleep hygiene, TV, caffeine intake. She has to weight weight loss against sleep problems.   4) quit smoking in 2013 , but continues to use nicotine lozenges ( helps with tourettes ) .    PLAN : lets reorder BIPAP machine for her. I would like to not have to try to re qualify her for biPAP.   She would like a cm more inspiratory pressure- I will order 19/ 13 cm water , ST 12.  She likes  her FFM with magnetic clips FFM I 30.      I would like to  thank Sophronia Ozell BROCKS, MD and Sophronia Ozell BROCKS, Md [address],  [place] [id number] for allowing me to meet with this pleasant patient.    Addendum   Sleep Clinic Patients are generally offered input on sleep hygiene, life style changes and how to improve compliance with medical treatment where applicable. Review and reiteration of good sleep hygiene measures is offered to any sleep clinic patient, be it in the first consultation or with any follow up visits.  Any CPAP patient should be reminded to be fully compliant with PAP therapy , (defined as using PAP therapy for more than 4 hours each night ) with the goal to improve sleep related symptoms and decrease long term cardiovascular risks. Any PAP therapy patient should be reminded, that it may take up to 3 months to get fully used to using PAP and it may take 1-2 weeks for an established CPAP user to acclimatize to changes in pressure or mask. The earlier full compliance is achieved, the better long term compliance tends to be.    Any patient with sleepiness should be cautioned not to drive, work at heights, or operate dangerous or heavy equipment when feeling tired or sleepy.      The patient will be seen in follow-up in the sleep clinic at [facility] for discussion of test results, sleep related symptoms and treatment compliance review, further management strategies, etc.    The patient's condition requires frequent monitoring and adjustments in the treatment plan, reflecting the ongoing complexity of care.  This provider is the continuing focal point for all needed services for this condition.  After spending a total time of  20  minutes face to face and time for  history taking, physical and neurologic examination, review of laboratory studies,  personal review of imaging studies, reports and results of other testing and review of referral information / records as far as provided in visit,   Electronically signed  by: Dedra Gores, MD 10/06/2023 11:17 AM  [name] Board certified by The [employer] of Sleep Medicine and Diplomate of the [employer] of Sleep Medicine. Board certified In Neurology through the ABPN, Fellow of the [employer] of Neu[employer].

## 2023-11-17 ENCOUNTER — Encounter: Payer: Self-pay | Admitting: Neurology

## 2023-12-21 ENCOUNTER — Ambulatory Visit: Admitting: Neurology

## 2024-01-05 ENCOUNTER — Encounter: Payer: Self-pay | Admitting: Adult Health

## 2024-01-05 ENCOUNTER — Ambulatory Visit: Admitting: Adult Health

## 2024-01-05 VITALS — BP 149/88 | HR 85 | Ht 65.0 in | Wt 186.0 lb

## 2024-01-05 DIAGNOSIS — Z9989 Dependence on other enabling machines and devices: Secondary | ICD-10-CM | POA: Diagnosis not present

## 2024-01-05 DIAGNOSIS — G4731 Primary central sleep apnea: Secondary | ICD-10-CM

## 2024-01-05 DIAGNOSIS — G4739 Other sleep apnea: Secondary | ICD-10-CM

## 2024-01-05 NOTE — Patient Instructions (Addendum)
 Your Plan:  Continue nightly use of BiPAP therapy for adequate sleep apnea management   Continue to follow with your DME company for any needed supplies or CPAP related concerns      Follow up in 1 year or call earlier if needed     Thank you for coming to see us  at [facility]. I hope we have been able to provide you high quality care today.  You may receive a patient satisfaction survey over the next few weeks. We would appreciate your feedback and comments so that we may continue to improve ourselves and the health of our patients.

## 2024-01-05 NOTE — Progress Notes (Signed)
 [name] [address] [place]. [place] [address] [phone]       OFFICE FOLLOW UP NOTE  [name] Date of Birth:  May 02, 1965 Medical Record Number:  [id number]    Primary neurologist: Dr. Chalice Reason for visit: Initial BiPAPfollow-up    SUBJECTIVE:   CHIEF COMPLAINT:  Chief Complaint  Patient presents with   BiPAP    Rm 3 - alone, download in chart; c/o possible apneic events in the afternoons    Follow-up visit:  Prior visit: 10/06/2023  Brief HPI:   Danielle Krueger is a 58 y.o. female who is followed for OSA on BiPAP.  Initially diagnosed in 2020 with evidence of severe sleep apnea and nocturnal hypoxemia.  Completed titration study which showed an adequate response with CPAP therefore transition to BiPAP therapy.  At prior visit, order placed to obtain new BiPAP machine his old machine now 58 years old.  Set up with new BiPAP 11/2023.   Interval history:  Patient returns for compliance visit after receiving new BiPAP machine.  She reports overall doing well with new machine and continues to use fullface mask without difficulty.  Reports continued benefit with BiPAP therapy in regards to daytime energy levels. ESS 1/24.  Reports she lost her husband in 02/2023 from FTD, had great difficulty sleeping but this has been gradually improving. She does note having apneic type events during the day while awake, has been occurring quite frequently.  No further questions or concerns at this time.        ROS:   14 system review of systems performed and negative with exception of those listed in HPI  PMH:  Past Medical History:  Diagnosis Date   Acne    Allergy    mild    Asthma    no issues in 20 years   Broken collarbone    Chest pain    Eval ER 5/24 R/O normal ECG negative markers, ? anxiety   Chronic bronchitis (HCC)    Complication of anesthesia    age 64ys, could not breathe after anes started    DDD  (degenerative disc disease), lumbar    Depressed    Dislocated shoulder    right    GAD (generalized anxiety disorder)    GERD (gastroesophageal reflux disease)    remote history    Headache    stress hedaches   Heart murmur    has closed   Hyperlipidemia    Neuromuscular disorder (HCC)    Tourette's syndrome- mild tics, no verbal issues    OSA treated with BiPAP    Pneumonia    remote history    PONV (postoperative nausea and vomiting)    Rosacea    Sleep apnea    wears BIPAP q night    Tourette's    eye blinking    PSH:  Past Surgical History:  Procedure Laterality Date   ABDOMINAL HYSTERECTOMY  2016   [facility] REPAIR SHOULDER Right    EXCISION OF SKIN TAG N/A 08/26/2019   Procedure: EXCISION OF ANAL SKIN TAG;  Surgeon: Debby Hila, MD;  Location: [facility];  Service: General;  Laterality: N/A;   FLEXIBLE SIGMOIDOSCOPY N/A 11/25/2019   Procedure: FLEXIBLE SIGMOIDOSCOPY;  Surgeon: Debby Hila, MD;  Location: [facility];  Service: Endoscopy;  Laterality: N/A;  IV SEDATION BY SURGEON   KNEE ARTHROSCOPY Left    x2   laser of vulva     MOUTH SURGERY  POLYPECTOMY  11/25/2019   Procedure: POLYPECTOMY;  Surgeon: Debby Hila, MD;  Location: [facility];  Service: Endoscopy;;   POLYPECTOMY  11/25/2019   TA x 2      RHINOPLASTY     age 53yrs   SIGMOIDOSCOPY  11/25/2019   incomplete colon , did flex , 2 polyps-  rescheduled colon with MAC    uterine ablation      Social History:  Social History   Socioeconomic History   Marital status: Married    Spouse name: Not on file   Number of children: Not on file   Years of education: Not on file   Highest education level: Not on file  Occupational History   Not on file  Tobacco Use   Smoking status: Former    Current packs/day: 0.00    Average packs/day: 1 pack/day for 15.0 years (15.0 ttl pk-yrs)    Types: Cigarettes    Start date: 32    Quit date: 2011    Years since quitting: 14.8    Smokeless tobacco: Never  Vaping Use   Vaping status: Never Used  Substance and Sexual Activity   Alcohol use: Yes    Comment: Very rare   Drug use: No   Sexual activity: Not on file  Other Topics Concern   Not on file  Social History Narrative   6 children combined remarriage. Works for [employer] scheme manager. Wants to work out at [employer].    Social Drivers of Corporate investment banker Strain: Low Risk  (05/15/2023)   Received from [facility]   Overall Financial Resource Strain (CARDIA)    Difficulty of Paying Living Expenses: Not hard at all  Food Insecurity: No Food Insecurity (05/15/2023)   Received from [facility]   Hunger Vital Sign    Within the past 12 months, you worried that your food would run out before you got the money to buy more.: Never true    Within the past 12 months, the food you bought just didn't last and you didn't have money to get more.: Never true  Transportation Needs: No Transportation Needs (05/15/2023)   Received from [facility] - Transportation    Lack of Transportation (Medical): No    Lack of Transportation (Non-Medical): No  Physical Activity: Insufficiently Active (05/15/2023)   Received from [facility]   Exercise Vital Sign    On average, how many days per week do you engage in moderate to strenuous exercise (like a brisk walk)?: 1 day    On average, how many minutes do you engage in exercise at this level?: 30 min  Stress: No Stress Concern Present (05/15/2023)   Received from [facility] - Occupational Stress Questionnaire    Feeling of Stress : Only a little  Social Connections: Socially Integrated (05/15/2023)   Received from [facility]   Social Network    How would you rate your social network (family, work, friends)?: Good participation with social networks  Intimate Partner Violence: Not At Risk (05/15/2023)   Received from [facility]   HITS    Over the last 12 months  how often did your partner physically hurt you?: Never    Over the last 12 months how often did your partner insult you or talk down to you?: Never    Over the last 12 months how often did your partner threaten you with physical harm?: Never    Over the  last 12 months how often did your partner scream or curse at you?: Never    Family History:  Family History  Problem Relation Age of Onset   Breast cancer Mother    Heart attack Mother    Sleep apnea Mother    Sleep apnea Father    Heart disease Father    Brain cancer Brother    Colon polyps Maternal Grandmother    Esophageal cancer Other    Coronary artery disease Neg Hx        premature   Colon cancer Neg Hx    Rectal cancer Neg Hx    Stomach cancer Neg Hx     Medications:   Current Outpatient Medications on File Prior to Visit  Medication Sig Dispense Refill   clonazePAM (KLONOPIN) 0.5 MG tablet Take 0.5 mg by mouth 2 (two) times daily as needed for anxiety.     cloNIDine  (CATAPRES ) 0.1 MG tablet Take 0.1 mg by mouth 3 (three) times daily. (Patient taking differently: Take 0.05 mg by mouth 3 (three) times daily.)     desvenlafaxine (PRISTIQ) 100 MG 24 hr tablet Take 200 mg by mouth daily.      nebivolol (BYSTOLIC) 2.5 MG tablet Take 2.5 mg by mouth daily.     progesterone (PROMETRIUM) 100 MG capsule Take 100 mg by mouth at bedtime.     rosuvastatin (CRESTOR) 10 MG tablet Take 10 mg by mouth every evening.      No current facility-administered medications on file prior to visit.    Allergies:   Allergies  Allergen Reactions   Metronidazole Rash    Topical Metro gel      OBJECTIVE:  Physical Exam  Vitals:   01/05/24 1538  BP: (!) 149/88  Pulse: 85  Weight: 186 lb (84.4 kg)  Height: 5' 5 (1.651 m)   Body mass index is 30.95 kg/m. No results found.  General: well developed, well nourished, very pleasant middle-age Caucasian female, seated, in no evident distress Head: head normocephalic and atraumatic.    Neck: supple with no carotid or supraclavicular bruits Cardiovascular: regular rate and rhythm, no murmurs  Neurologic Exam Mental Status: Awake and fully alert. Oriented to place and time. Recent and remote memory intact. Attention span, concentration and fund of knowledge appropriate. Mood and affect appropriate.  Cranial Nerves: Pupils equal, briskly reactive to light. Extraocular movements full without nystagmus. Visual fields full to confrontation. Hearing intact. Facial sensation intact. Face, tongue, palate moves normally and symmetrically.  Motor: Normal bulk and tone. Normal strength in all tested extremity muscles Gait and Station: Arises from chair without difficulty. Stance is normal. Gait demonstrates normal stride length and balance without use of AD.         ASSESSMENT/PLAN: Diannie K Magallon is a 58 y.o. year old female    OSA on BiPAP :  Compliance report shows satisfactory usage with optimal residual AHI.   Patient questions apneic events during the day, will follow up with Dr. Chalice regarding input. She was advised to call if this should worsen Continue current pressure settings of 19/13 with RR 12 Discussed continued nightly usage with ensuring greater than 4 hours nightly for optimal benefit and per insurance purposes.   Continue to follow with DME company [employer] for any needed supplies or CPAP related concerns BiPAP set up 11/2023     Follow up in 1 year via MyChart video visit or call earlier if needed   CC:  PCP: Sophronia Ozell BROCKS, MD  I personally spent a total of 25 minutes in the care of the patient today including preparing to see the patient, getting/reviewing separately obtained history, performing a medically appropriate exam/evaluation, counseling and educating, placing orders, referring and communicating with other health care professionals, and documenting clinical information in the EHR.  Harlene Bogaert, AGNP-BC  [facility] [address] [place], [place] [address]  Phone [phone] Fax [phone] Note: This document was prepared with digital dictation and possible smart phrase technology. Any transcriptional errors that result from this process are unintentional.

## 2024-01-06 NOTE — Progress Notes (Signed)
 Not that she is aware of. Only during the day while awake is she aware of them. When asked if this occurs daily, she says well they happen frequently but unable to say if daily.

## 2024-01-11 NOTE — Progress Notes (Signed)
 She is currently on BiPAP. Is this to ensure pressure settings are correct or possibly needing ASV therapy? Just want to make sure that I put the correct reason down for this titration when ordering. Thank you.

## 2024-01-28 ENCOUNTER — Encounter: Payer: Self-pay | Admitting: Adult Health

## 2025-01-05 ENCOUNTER — Telehealth: Admitting: Adult Health
